# Patient Record
Sex: Female | Born: 1970
Health system: Southern US, Community
[De-identification: ages and names within clinical notes are randomized; demographics above are authoritative.]

## PROBLEM LIST (undated history)

## (undated) DIAGNOSIS — F329 Major depressive disorder, single episode, unspecified: Secondary | ICD-10-CM

## (undated) DIAGNOSIS — N809 Endometriosis, unspecified: Secondary | ICD-10-CM

## (undated) DIAGNOSIS — D219 Benign neoplasm of connective and other soft tissue, unspecified: Secondary | ICD-10-CM

## (undated) DIAGNOSIS — F32A Depression, unspecified: Secondary | ICD-10-CM

## (undated) HISTORY — DX: Benign neoplasm of connective and other soft tissue, unspecified: D21.9

## (undated) HISTORY — DX: Depression, unspecified: F32.A

## (undated) HISTORY — PX: DILATION AND CURETTAGE OF UTERUS: SHX78

## (undated) HISTORY — DX: Major depressive disorder, single episode, unspecified: F32.9

## (undated) HISTORY — PX: LAPAROSCOPY ABDOMEN DIAGNOSTIC: PRO50

## (undated) HISTORY — DX: Endometriosis, unspecified: N80.9

---

## 2007-09-04 LAB — HM COLONOSCOPY

## 2017-07-18 LAB — HM PAP SMEAR: HM PAP: NEGATIVE

## 2017-07-18 LAB — HM MAMMOGRAPHY

## 2018-04-03 ENCOUNTER — Telehealth: Payer: Self-pay | Admitting: Family Medicine

## 2018-04-03 NOTE — Telephone Encounter (Signed)
Received requested records from [facility]. Included are labs, notes, paps and mammograms. Sending back for review.

## 2018-04-04 ENCOUNTER — Encounter: Payer: Self-pay | Admitting: Internal Medicine

## 2018-04-07 ENCOUNTER — Encounter: Payer: Self-pay | Admitting: Family Medicine

## 2018-04-07 ENCOUNTER — Ambulatory Visit: Payer: BLUE CROSS/BLUE SHIELD | Admitting: Family Medicine

## 2018-04-07 VITALS — BP 110/70 | HR 76 | Ht 69.5 in | Wt 173.0 lb

## 2018-04-07 DIAGNOSIS — G8929 Other chronic pain: Secondary | ICD-10-CM | POA: Insufficient documentation

## 2018-04-07 DIAGNOSIS — F3342 Major depressive disorder, recurrent, in full remission: Secondary | ICD-10-CM | POA: Insufficient documentation

## 2018-04-07 DIAGNOSIS — Z7689 Persons encountering health services in other specified circumstances: Secondary | ICD-10-CM

## 2018-04-07 DIAGNOSIS — R5383 Other fatigue: Secondary | ICD-10-CM | POA: Diagnosis not present

## 2018-04-07 DIAGNOSIS — N809 Endometriosis, unspecified: Secondary | ICD-10-CM | POA: Diagnosis not present

## 2018-04-07 DIAGNOSIS — R51 Headache: Secondary | ICD-10-CM

## 2018-04-07 DIAGNOSIS — F32A Depression, unspecified: Secondary | ICD-10-CM

## 2018-04-07 DIAGNOSIS — R519 Headache, unspecified: Secondary | ICD-10-CM | POA: Insufficient documentation

## 2018-04-07 DIAGNOSIS — F329 Major depressive disorder, single episode, unspecified: Secondary | ICD-10-CM

## 2018-04-07 NOTE — Progress Notes (Signed)
Subjective:    Patient ID: Victoria Baxter, female    DOB: 09/03/1971, 47 y.o.   MRN: [id number]  HPI Chief Complaint  Patient presents with  . new pt    new pt get estbalished. depression   She is new to the practice. Here to establish care and for concerns regarding depression and other chronic health conditions. Moved here 2 months ago.  Previous medical care: [place], [place].   States she was healthy prior to 2000. She was then diagnosed with endometriosis and depression.  2008 depression got worse.  She has been on Effexor and Wellbutrin since 2008 and stable however since moving here in July she has been more depressed and even had thoughts of SI. She reports she would never actually hurt herself.   Had a psychiatrist and counselor in [place]. Has not established with anyone here yet.  ?ADHD in the past.  Has been on several psych medications in the past and some have caused her symptoms to worsen.    States she gets "hormonal migraines". Usually excedrin helps.  Triggered by cigarette smoke and other things as well. Thinks certain foods also are triggers.   States she has had allergy testing in the past.  Does wood working   History of fibroids and endometriosis. Questionable abnormal pap smears in the past. HPV status unclear.   Mother diagnosed with uterine cancer.  She was seeing an OB/GYN in [place]. Has not established here yet.  Last Gynecological Exam: 2018. States she gets pap smear annually.  She is on OCPs.   Other providers: Plans to schedule with a psychiatrist and counselor here.  OB/GYN   Social history: Lives alone with her dog and cats, works at [employer] only PT for now.  Has a friend who lives here.  No children.  Is involved in her church.   Denies smoking, drinks alcohol rarely, denies drug use  Colonoscopy 2013  Mammogram 07/18/2017 and normal.   Depression screen PHQ 2/9 04/07/2018  Decreased Interest 2  Down, Depressed, Hopeless 2  PHQ  - 2 Score 4  Altered sleeping 3  Tired, decreased energy 3  Change in appetite 3  Feeling bad or failure about yourself  3  Trouble concentrating 3  Moving slowly or fidgety/restless 3  Suicidal thoughts 3  PHQ-9 Score 25  Difficult doing work/chores Somewhat difficult     Reviewed allergies, medications, past medical, surgical, family, and social history.    Review of Systems Pertinent positives and negatives in the history of present illness.     Objective:   Physical Exam BP 110/70   Pulse 76   Ht 5' 9.5" (1.765 m)   Wt 173 lb (78.5 kg)   BMI 25.18 kg/m   Alert and oriented and in no acute distress. Not otherwise examined.       Assessment & Plan:  Depression, unspecified depression type  Endometriosis  Encounter to establish care  Fatigue, unspecified type  Chronic nonintractable headache, unspecified headache type  She declines labs and plans to return for a fasting CPE and will get labs at that visit.  PHQ-9 is significantly elevated. Discussed this with her.  She does not appear to be in any danger or have a plan to harm herself or others. I strongly advised that if her depression worsens before being able to see a psychiatrist or therapist that she return here and if she has any thoughts of self harm that she call 911 or go to the [facility]  ED. The address was provided to her.  A list of psychiatrists was provided. She will call and schedule an appointment. No medication changes today.  Fatigue is chronic. Discussed multiple etiologies but this is not new and may be linked to depression.  She will call and schedule with an OB/GYN due to history of endometriosis, fibroids, and now mother with uterine cancer. She reports getting annual pap smears. Last one in [place]. No HPV result found in her results. Negative pap smear result in 2018. These will be scanned into her EMR.  Unclear headache etiology. She will continue to monitor triggers.  Follow up for  fasting CPE.

## 2018-04-07 NOTE — Patient Instructions (Addendum)
[facility] emergency department [address] [place], [place] [address]  You can call to schedule your appointment with a psychiatrist and/or counselor. A few offices are listed below for you to call.    [facility]  [place], [place], [place] [address]  Phone: [phone]  [place] [address] [place], [place] [address]  Phone: [phone]    Obgyn Offices:   [place] [address] [place], [place] [phone]  [facility] Address: [address]                   [place], [place] [address] Phone: [phone]  [place] [address] [place], [place] [address] Phone: [phone]  [place],  [address] Phone: [phone]

## 2018-04-08 ENCOUNTER — Encounter: Payer: Self-pay | Admitting: Family Medicine

## 2018-04-08 NOTE — Progress Notes (Signed)
ERROR

## 2018-04-14 ENCOUNTER — Telehealth: Payer: Self-pay | Admitting: Family Medicine

## 2018-04-14 MED ORDER — BUPROPION HCL ER (XL) 150 MG PO TB24: 150.0000 mg | ORAL_TABLET | Freq: Every day | ORAL | 2 refills | Status: DC

## 2018-04-14 MED ORDER — VENLAFAXINE HCL ER 75 MG PO CP24: 75.0000 mg | ORAL_CAPSULE | Freq: Every day | ORAL | 2 refills | Status: DC

## 2018-04-14 NOTE — Telephone Encounter (Signed)
Pt left message that she hasnt gotten her refills for Effexor and Wellbutrin from [facility].  Also she was not sure which [facility] you were sending them to.

## 2018-04-14 NOTE — Telephone Encounter (Signed)
We did not refill this at her time of appt. I have refilled this for 3 months that way she has time to get in with a psych per noted in the office visit notes

## 2018-04-14 NOTE — Telephone Encounter (Signed)
Can you check on this please. Thanks.

## 2018-04-30 ENCOUNTER — Encounter: Payer: BLUE CROSS/BLUE SHIELD | Admitting: Family Medicine

## 2018-05-01 ENCOUNTER — Ambulatory Visit: Payer: BLUE CROSS/BLUE SHIELD | Admitting: Family Medicine

## 2018-05-01 ENCOUNTER — Encounter: Payer: Self-pay | Admitting: Family Medicine

## 2018-05-01 VITALS — BP 120/78 | HR 71 | Ht 70.0 in | Wt 172.0 lb

## 2018-05-01 DIAGNOSIS — F32A Depression, unspecified: Secondary | ICD-10-CM

## 2018-05-01 DIAGNOSIS — Z13220 Encounter for screening for lipoid disorders: Secondary | ICD-10-CM | POA: Diagnosis not present

## 2018-05-01 DIAGNOSIS — Z23 Encounter for immunization: Secondary | ICD-10-CM | POA: Diagnosis not present

## 2018-05-01 DIAGNOSIS — F329 Major depressive disorder, single episode, unspecified: Secondary | ICD-10-CM | POA: Diagnosis not present

## 2018-05-01 DIAGNOSIS — Z0000 Encounter for general adult medical examination without abnormal findings: Secondary | ICD-10-CM

## 2018-05-01 LAB — POCT URINALYSIS DIP (PROADVANTAGE DEVICE)
BILIRUBIN UA: NEGATIVE
GLUCOSE UA: NEGATIVE mg/dL
Ketones, POC UA: NEGATIVE mg/dL
Leukocytes, UA: NEGATIVE
NITRITE UA: NEGATIVE
PH UA: 5.5 (ref 5.0–8.0)
Protein Ur, POC: NEGATIVE mg/dL
SPECIFIC GRAVITY, URINE: 1.03
UUROB: NEGATIVE

## 2018-05-01 NOTE — Progress Notes (Signed)
Subjective:    Patient ID: Victoria Baxter, female    DOB: 08/27/1971, 47 y.o.   MRN: [id number]  HPI Chief Complaint  Patient presents with  . fasting cpe    fasting cpe- no other concerns   She is fairly new to the practice and here for a complete physical exam. No new concerns today.   No longer feeling depressed as she did in early August. Taking medication and doing well.   Other providers: Plans to schedule with a psychiatrist and counselor here.  OB/GYN   Social history: Lives alone with her dog and cats, works at [employer] only PT for now.  Has a friend who lives here.  No children.  Is involved in her church.   Denies smoking, drinking alcohol, drug use  Diet: fairly healthy  Excerise: nothing lately   Immunizations: Tdap unknown.. Declines flu for the year   Health maintenance:  Mammogram: 07/18/2017 and normal  Colonoscopy: 2013 Scheduled with OB/GYN next week  Last Dental Exam: last year  Last Eye Exam: upcoming appointment    Depression screen [facility] 2/9 05/01/2018 04/07/2018  Decreased Interest 1 2  Down, Depressed, Hopeless 0 2  PHQ - 2 Score 1 4  Altered sleeping - 3  Tired, decreased energy - 3  Change in appetite - 3  Feeling bad or failure about yourself  - 3  Trouble concentrating - 3  Moving slowly or fidgety/restless - 3  Suicidal thoughts - 3  PHQ-9 Score - 25  Difficult doing work/chores - Somewhat difficult     Wears seatbelt always, uses sunscreen, smoke detectors in home and functioning, does not text while driving and feels safe in home environment.   Reviewed allergies, medications, past medical, surgical, family, and social history.    Review of Systems Review of Systems Constitutional: -fever, -chills, -sweats, -unexpected weight change,-fatigue ENT: -runny nose, -ear pain, -sore throat Cardiology:  -chest pain, -palpitations, -edema Respiratory: -cough, -shortness of breath, -wheezing Gastroenterology: -abdominal  pain, -nausea, -vomiting, -diarrhea, -constipation  Hematology: -bleeding or bruising problems Musculoskeletal: -arthralgias, -myalgias, -joint swelling, -back pain Ophthalmology: -vision changes Urology: -dysuria, -difficulty urinating, -hematuria, -urinary frequency, -urgency Neurology: -headache, -weakness, -tingling, -numbness       Objective:   Physical Exam BP 120/78   Pulse 71   Ht 5\' 10"  (1.778 m)   Wt 172 lb (78 kg)   LMP 04/24/2018   BMI 24.68 kg/m   General Appearance:    Alert, cooperative, no distress, appears stated age  Head:    Normocephalic, without obvious abnormality, atraumatic  Eyes:    PERRL, conjunctiva/corneas clear, EOM's intact, fundi    benign  Ears:    Normal TM's and external ear canals  Nose:   Nares normal, mucosa normal, no drainage or sinus   tenderness  Throat:   Lips, mucosa, and tongue normal; teeth and gums normal  Neck:   Supple, no lymphadenopathy;  thyroid:  no   enlargement/tenderness/nodules; no carotid   bruit or JVD  Back:    Spine nontender, no curvature, ROM normal, no CVA     tenderness  Lungs:     Clear to auscultation bilaterally without wheezes, rales or     ronchi; respirations unlabored  Chest Wall:    No tenderness or deformity   Heart:    Regular rate and rhythm, S1 and S2 normal, no murmur, rub   or gallop  Breast Exam:    OB/GYN  Abdomen:     Soft, non-tender,  nondistended, normoactive bowel sounds,    no masses, no hepatosplenomegaly  Genitalia:    OB/GYN     Extremities:   No clubbing, cyanosis or edema  Pulses:   2+ and symmetric all extremities  Skin:   Skin color, texture, turgor normal, no rashes or lesions  Lymph nodes:   Cervical, supraclavicular, and axillary nodes normal  Neurologic:   CNII-XII intact, normal strength, sensation and gait; reflexes 2+ and symmetric throughout          Psych:   Normal mood, affect, hygiene and grooming.     Urinalysis dipstick: trace blood, finished cycle yesterday          Assessment & Plan:  Routine general medical examination at a health care facility - Plan: POCT Urinalysis DIP (Proadvantage Device), CBC with Differential/Platelet, Comprehensive metabolic panel, TSH, T4, free, Lipid panel  Depression, unspecified depression type  Need for Tdap vaccination - Plan: Tdap vaccine greater than or equal to 7yo IM  Screening for lipid disorders - Plan: Lipid panel  She is doing much better emotionally than when I saw her earlier this month. Seems to be getting settled in.  In better spirits. PHQ-9 improved significantly.  Discussed safety and health promotions.  She will see her OB/GYN next week.  Due for colonoscopy next year.  Tdap given and all components of vaccine discussed. Declines flu shot.  Follow up pending labs.

## 2018-05-01 NOTE — Patient Instructions (Signed)

## 2018-05-02 ENCOUNTER — Telehealth: Payer: Self-pay | Admitting: Family Medicine

## 2018-05-02 LAB — CBC WITH DIFFERENTIAL/PLATELET
BASOS: 1 %
Basophils Absolute: 0.1 10*3/uL (ref 0.0–0.2)
EOS (ABSOLUTE): 0.1 10*3/uL (ref 0.0–0.4)
EOS: 1 %
HEMATOCRIT: 46.5 % (ref 34.0–46.6)
Hemoglobin: 16.2 g/dL — ABNORMAL HIGH (ref 11.1–15.9)
Immature Grans (Abs): 0 10*3/uL (ref 0.0–0.1)
Immature Granulocytes: 0 %
LYMPHS ABS: 2.8 10*3/uL (ref 0.7–3.1)
Lymphs: 43 %
MCH: 31.3 pg (ref 26.6–33.0)
MCHC: 34.8 g/dL (ref 31.5–35.7)
MCV: 90 fL (ref 79–97)
MONOS ABS: 0.3 10*3/uL (ref 0.1–0.9)
Monocytes: 4 %
Neutrophils Absolute: 3.4 10*3/uL (ref 1.4–7.0)
Neutrophils: 51 %
PLATELETS: 329 10*3/uL (ref 150–450)
RBC: 5.17 x10E6/uL (ref 3.77–5.28)
RDW: 12.4 % (ref 12.3–15.4)
WBC: 6.7 10*3/uL (ref 3.4–10.8)

## 2018-05-02 LAB — LIPID PANEL
CHOL/HDL RATIO: 3.7 ratio (ref 0.0–4.4)
Cholesterol, Total: 231 mg/dL — ABNORMAL HIGH (ref 100–199)
HDL: 63 mg/dL (ref >39)
LDL CALC: 141 mg/dL — AB (ref 0–99)
TRIGLYCERIDES: 135 mg/dL (ref 0–149)
VLDL Cholesterol Cal: 27 mg/dL (ref 5–40)

## 2018-05-02 LAB — COMPREHENSIVE METABOLIC PANEL
ALK PHOS: 95 IU/L (ref 39–117)
ALT: 32 IU/L (ref 0–32)
AST: 22 IU/L (ref 0–40)
Albumin/Globulin Ratio: 1.8 (ref 1.2–2.2)
Albumin: 4.6 g/dL (ref 3.5–5.5)
BILIRUBIN TOTAL: 0.5 mg/dL (ref 0.0–1.2)
BUN / CREAT RATIO: 24 — AB (ref 9–23)
BUN: 19 mg/dL (ref 6–24)
CO2: 24 mmol/L (ref 20–29)
CREATININE: 0.8 mg/dL (ref 0.57–1.00)
Calcium: 9.8 mg/dL (ref 8.7–10.2)
Chloride: 104 mmol/L (ref 96–106)
GFR, EST AFRICAN AMERICAN: 100 mL/min/{1.73_m2} (ref >59)
GFR, EST NON AFRICAN AMERICAN: 87 mL/min/{1.73_m2} (ref >59)
GLOBULIN, TOTAL: 2.5 g/dL (ref 1.5–4.5)
Glucose: 85 mg/dL (ref 65–99)
Potassium: 5.3 mmol/L — ABNORMAL HIGH (ref 3.5–5.2)
Sodium: 142 mmol/L (ref 134–144)
Total Protein: 7.1 g/dL (ref 6.0–8.5)

## 2018-05-02 LAB — T4, FREE: Free T4: 0.91 ng/dL (ref 0.82–1.77)

## 2018-05-02 LAB — TSH: TSH: 1.35 u[IU]/mL (ref 0.450–4.500)

## 2018-05-02 NOTE — Telephone Encounter (Signed)
Received requested records from [facility]. Sending back for review.

## 2018-05-07 ENCOUNTER — Encounter: Payer: Self-pay | Admitting: Obstetrics and Gynecology

## 2018-05-07 ENCOUNTER — Other Ambulatory Visit: Payer: Self-pay

## 2018-05-07 ENCOUNTER — Ambulatory Visit: Payer: BLUE CROSS/BLUE SHIELD | Admitting: Obstetrics and Gynecology

## 2018-05-07 VITALS — BP 118/78 | HR 76 | Resp 14 | Ht 70.5 in | Wt 171.2 lb

## 2018-05-07 DIAGNOSIS — Z01411 Encounter for gynecological examination (general) (routine) with abnormal findings: Secondary | ICD-10-CM | POA: Diagnosis not present

## 2018-05-07 DIAGNOSIS — Z86018 Personal history of other benign neoplasm: Secondary | ICD-10-CM

## 2018-05-07 DIAGNOSIS — Z8742 Personal history of other diseases of the female genital tract: Secondary | ICD-10-CM | POA: Diagnosis not present

## 2018-05-07 DIAGNOSIS — Z7689 Persons encountering health services in other specified circumstances: Secondary | ICD-10-CM | POA: Diagnosis not present

## 2018-05-07 MED ORDER — NORETHIN ACE-ETH ESTRAD-FE 1.5-30 MG-MCG PO TABS: 1.0000 | ORAL_TABLET | Freq: Every day | ORAL | 0 refills | Status: DC

## 2018-05-07 NOTE — Progress Notes (Signed)
47 y.o. G0P0000 SingleCaucasianF here for annual exam.  She is on OCP's, cycling every 2 months. Was spotting on 3 month cycle. She has a h/o endometriosis, diagnosed on laparoscopy. She was told it "looked like a bomb went off in her pelvis". Treated as much of the endometriosis as possible, then treated with lupron for 6 months. She is on OCP's for suppression, which is helping.  She has migraines without aura, worse the week prior to and off her cycle.  She has had worsening migraines in the last month, thinks it is environmental. Food can trigger her headaches.  She has had bad depression in the past, worse on OCP's, better with the OCP with iron. She is on medication for her depression, which helps. Last month was a bad month, currently better. Just moved here in June, 2019. She also has a h/o fibroid uterus, h/o irregular bleeding. She has never had very bad cramps.  Not sexually active.   Period Cycle (Days): 60 Period Duration (Days): 7 Period Pattern: (!) Irregular Menstrual Flow: Light Menstrual Control: Panty liner Dysmenorrhea: (!) Mild Dysmenorrhea Symptoms: Other (Comment)(lower back pain )  Patient's last menstrual period was 04/24/2018.          Sexually active: No.  The current method of family planning is OCP (estrogen/progesterone).    Exercising: No.  The patient does not participate in regular exercise at present. Smoker:  no  Health Maintenance: Pap:  07-18-17 negative  History of abnormal Pap:  no MMG:  07-18-17 BIRADS 1 negative   Colonoscopy: ?2013 BMD:   n/a TDaP:  05-01-18  Gardasil: no    reports that she has never smoked. She has never used smokeless tobacco. She reports that she drinks alcohol. She reports that she does not use drugs. She has a 24 year old dog and cats. 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 Moved her for a change. Likes it here. Has a really good friend who lives here.   Past Medical History:  Diagnosis Date  .  Depression   . Endometriosis   . Fibroid     Past Surgical History:  Procedure Laterality Date  . DILATION AND CURETTAGE OF UTERUS    . LAPAROSCOPY ABDOMEN DIAGNOSTIC      Current Outpatient Medications  Medication Sig Dispense Refill  . ASHWAGANDHA PO Take by mouth.    Marland Kitchen buPROPion (WELLBUTRIN XL) 150 MG 24 hr tablet Take 1 tablet (150 mg total) by mouth daily. 30 tablet 2  . Cholecalciferol (VITAMIN D PO) Take by mouth daily.     . NON FORMULARY 700 mg. Guduchi     . norethindrone-ethinyl estradiol-iron (BLISOVI FE 1.5/30) 1.5-30 MG-MCG tablet Take 1 tablet by mouth daily.    Marland Kitchen venlafaxine XR (EFFEXOR-XR) 75 MG 24 hr capsule Take 1 capsule (75 mg total) by mouth daily with breakfast. 30 capsule 2   No current facility-administered medications for this visit.     Family History  Problem Relation Age of Onset  . Uterine cancer Mother 53  . Other Mother        brain tumor 2001  . Basal cell carcinoma Father   . Prostate cancer Father   . Other Father        sarcomatoid carcinoma  . Breast cancer Maternal Aunt   . Cancer Paternal Aunt   . Diabetes Paternal Grandmother     Review of Systems  Constitutional: Negative.   HENT: Negative.   Eyes: Negative.  Respiratory: Negative.   Cardiovascular: Negative.   Gastrointestinal: Negative.   Endocrine: Negative.   Genitourinary: Negative.   Musculoskeletal: Negative.   Skin: Negative.   Allergic/Immunologic: Negative.   Neurological: Negative.   Hematological: Negative.   Psychiatric/Behavioral: Negative.     Exam:   BP 118/78 (BP Location: Right Arm, Patient Position: Sitting, Cuff Size: Normal)   Pulse 76   Resp 14   Ht 5' 10.5" (1.791 m)   Wt 171 lb 4 oz (77.7 kg)   LMP 04/24/2018   BMI 24.22 kg/m   Weight change: @WEIGHTCHANGE @ Height:   Height: 5' 10.5" (179.1 cm)  Ht Readings from Last 3 Encounters:  05/07/18 5' 10.5" (1.791 m)  05/01/18 5\' 10"  (1.778 m)  04/07/18 5' 9.5" (1.765 m)    General  appearance: alert, cooperative and appears stated age  A:  Establish care  H/O endometriosis, on OCP's for suppression  H/O fibroid uterus  H/O Depression, on medication  H/O migraine headaches without aura, recently worse, likely environmental  P:   Patient will return in 11/19 for an annual exam  Mammogram due in 11/19, # given  Refill for OCP's sent   If her migraines get worse again she will f/u with her primary   Records reviewed: -U/S 4/12: 5 fibroids, 4 pedunculated, one posterior myometrium (1.4 cm , 3 cm, 7.9 cm, 3 cm, 4.2 cm). Overal uterus not measured  -On review of records mention of history of galactorrhea -After her endometriosis surgery she was treated with danzol for 6 months -Hysteroscopy, D&C 03/24/07, no intracavitary defects -03/19/11, Laparoscopy with rupture of left ovarian cyst and multiple biopsies, mild diffuse pelvic endometriosis was seen and fibroids were noted. It was a "chocholate" cyst (not removed, just aspirated). Several brown lesions were noted on the parietal peritoneum lateral to the lever, suspicious for endometriosis.   Over 20 minutes was spent face to face with the patient, over 50% in counseling

## 2018-07-08 ENCOUNTER — Other Ambulatory Visit: Payer: Self-pay | Admitting: Family Medicine

## 2018-07-08 NOTE — Telephone Encounter (Signed)
Is this okay to refill? 

## 2018-07-09 ENCOUNTER — Encounter: Payer: Self-pay | Admitting: Family Medicine

## 2018-07-09 NOTE — Telephone Encounter (Signed)
Ok to give her 90 days and 1 refill. I reviewed her records from previous PCP

## 2018-07-23 NOTE — Progress Notes (Signed)
47 y.o. G0P0000 Single White or Caucasian Not Hispanic or Latino female here for annual exam.  On OCP's to suppress endometriosis.  Period Cycle (Days): 56 Period Duration (Days): 7 days  Period Pattern: Regular(due to OCP) Menstrual Flow: Light Menstrual Control: Thin pad Menstrual Control Change Freq (Hours): changes pad once a day Dysmenorrhea: None  Not sexually active.  Discussed h/o galactorrhea, patient states she hasn't had this for years.   After her visit to establish care her records were received and reviewed: Records reviewed: -U/S 4/12: 5 fibroids, 4 pedunculated, one posterior myometrium (1.4 cm , 3 cm, 7.9 cm, 3 cm, 4.2 cm). Overal uterus not measured  -On review of records mention of history of galactorrhea -After her endometriosis surgery she was treated with danzol for 6 months -Hysteroscopy, D&C 03/24/07, no intracavitary defects -03/19/11, Laparoscopy with rupture of left ovarian cyst and multiple biopsies, mild diffuse pelvic endometriosis was seen and fibroids were noted. It was a "chocholate" cyst (not removed, just aspirated). Several brown lesions were noted on the parietal peritoneum lateral to the lever, suspicious for endometriosis.  Patient's last menstrual period was 06/04/2018 (exact date).          Sexually active: No.  The current method of family planning is OCP (estrogen/progesterone).    Exercising: No.  The patient does not participate in regular exercise at present. Smoker:  no  Health Maintenance: Pap:  07-18-17 negative  History of abnormal Pap:  no MMG:  07-18-17 BIRADS 1 negative   Colonoscopy: 09/04/2007 BMD:   n/a TDaP:  05-01-18  Gardasil: no    reports that she has never smoked. She has never used smokeless tobacco. She reports that she drinks alcohol. She reports that she does not use drugs. Rare ETOH. She has a 96 year old dog (Production assistant, radio dog/Boxer mix) and cats. 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 Family is in  [place].   Past Medical History:  Diagnosis Date  . Depression   . Depression   . Endometriosis   . Fibroid     Past Surgical History:  Procedure Laterality Date  . DILATION AND CURETTAGE OF UTERUS    . LAPAROSCOPY ABDOMEN DIAGNOSTIC      Current Outpatient Medications  Medication Sig Dispense Refill  . ASHWAGANDHA PO Take by mouth.    Marland Kitchen buPROPion (WELLBUTRIN XL) 150 MG 24 hr tablet TAKE 1 TABLET(150 MG) BY MOUTH DAILY 30 tablet 5  . Cholecalciferol (VITAMIN D PO) Take by mouth daily.     . NON FORMULARY 700 mg. Guduchi     . norethindrone-ethinyl estradiol-iron (BLISOVI FE 1.5/30) 1.5-30 MG-MCG tablet Take 1 tablet by mouth daily. Take continuously for 2-3 months in a row. 4 Package 0  . venlafaxine XR (EFFEXOR-XR) 75 MG 24 hr capsule TAKE 1 CAPSULE(75 MG) BY MOUTH DAILY WITH BREAKFAST 30 capsule 5   No current facility-administered medications for this visit.     Family History  Problem Relation Age of Onset  . Uterine cancer Mother 3  . Other Mother        brain tumor 2001  . Basal cell carcinoma Father   . Prostate cancer Father   . Other Father        sarcomatoid carcinoma  . Breast cancer Maternal Aunt   . Cancer Paternal Aunt   . Diabetes Paternal Grandmother     Review of Systems  Constitutional: Negative.   HENT: Negative.   Eyes: Negative.   Respiratory:  Negative.   Cardiovascular: Negative.   Gastrointestinal: Negative.   Endocrine: Negative.   Genitourinary: Negative.   Musculoskeletal: Negative.   Skin: Negative.   Allergic/Immunologic: Negative.   Neurological: Negative.   Hematological: Negative.   Psychiatric/Behavioral: Negative.     Exam:   BP 122/78 (BP Location: Right Arm, Patient Position: Sitting, Cuff Size: Normal)   Pulse 68   Ht 5\' 10"  (1.778 m)   Wt 173 lb 3.2 oz (78.6 kg)   LMP 06/04/2018 (Exact Date) Comment: on OCP  BMI 24.85 kg/m   Weight change: @WEIGHTCHANGE @ Height:   Height: 5\' 10"  (177.8 cm)  Ht Readings from Last 3  Encounters:  07/24/18 5\' 10"  (1.778 m)  05/07/18 5' 10.5" (1.791 m)  05/01/18 5\' 10"  (1.778 m)    General appearance: alert, cooperative and appears stated age Head: Normocephalic, without obvious abnormality, atraumatic Neck: no adenopathy, supple, symmetrical, trachea midline and thyroid normal to inspection and palpation Lungs: clear to auscultation bilaterally Cardiovascular: regular rate and rhythm Breasts: normal appearance, no masses or tenderness Abdomen: soft, non-tender; non distended,  no masses,  no organomegaly Extremities: extremities normal, atraumatic, no cyanosis or edema Skin: Skin color, texture, turgor normal. No rashes or lesions Lymph nodes: Cervical, supraclavicular, and axillary nodes normal. No abnormal inguinal nodes palpated Neurologic: Grossly normal   Pelvic: External genitalia:  no lesions              Urethra:  normal appearing urethra with no masses, tenderness or lesions              Bartholins and Skenes: normal                 Vagina: normal appearing vagina with normal color and discharge, no lesions              Cervix: no lesions               Bimanual Exam:  Uterus:  uterus retroverted, ~10 week sized, non tender, mobile.               Adnexa: no mass, fullness, tenderness               Rectovaginal: Confirms               Anus:  normal sphincter tone, no lesions  Chaperone was present for exam.  A:  Well Woman with normal exam  H/O endometriosis, on OCP's for suspression  Fibroid uterus  P:   Pap with hpv  Discussed breast self exam  Discussed calcium and vit D intake  Labs with primary  IFOB this year, colonoscopy next year  Mammogram due  Continue OCP's

## 2018-07-24 ENCOUNTER — Other Ambulatory Visit: Payer: Self-pay

## 2018-07-24 ENCOUNTER — Other Ambulatory Visit (HOSPITAL_COMMUNITY)
Admission: RE | Admit: 2018-07-24 | Discharge: 2018-07-24 | Disposition: A | Discharge status: Home / Self Care | Payer: BLUE CROSS/BLUE SHIELD | Source: Ambulatory Visit | Attending: Obstetrics and Gynecology | Admitting: Obstetrics and Gynecology

## 2018-07-24 ENCOUNTER — Ambulatory Visit (INDEPENDENT_AMBULATORY_CARE_PROVIDER_SITE_OTHER): Payer: BLUE CROSS/BLUE SHIELD | Admitting: Obstetrics and Gynecology

## 2018-07-24 ENCOUNTER — Encounter: Payer: Self-pay | Admitting: Obstetrics and Gynecology

## 2018-07-24 VITALS — BP 122/78 | HR 68 | Ht 70.0 in | Wt 173.2 lb

## 2018-07-24 DIAGNOSIS — Z124 Encounter for screening for malignant neoplasm of cervix: Secondary | ICD-10-CM | POA: Insufficient documentation

## 2018-07-24 DIAGNOSIS — Z3041 Encounter for surveillance of contraceptive pills: Secondary | ICD-10-CM

## 2018-07-24 DIAGNOSIS — Z01419 Encounter for gynecological examination (general) (routine) without abnormal findings: Secondary | ICD-10-CM | POA: Diagnosis not present

## 2018-07-24 DIAGNOSIS — Z1211 Encounter for screening for malignant neoplasm of colon: Secondary | ICD-10-CM

## 2018-07-24 DIAGNOSIS — D259 Leiomyoma of uterus, unspecified: Secondary | ICD-10-CM

## 2018-07-24 DIAGNOSIS — Z8742 Personal history of other diseases of the female genital tract: Secondary | ICD-10-CM

## 2018-07-24 MED ORDER — NORETHIN ACE-ETH ESTRAD-FE 1.5-30 MG-MCG PO TABS: 1.0000 | ORAL_TABLET | Freq: Every day | ORAL | 3 refills | Status: DC

## 2018-07-24 NOTE — Patient Instructions (Signed)
EXERCISE AND DIET:  We recommended that you start or continue a regular exercise program for good health. Regular exercise means any activity that makes your heart beat faster and makes you sweat.  We recommend exercising at least 30 minutes per day at least 3 days a week, preferably 4 or 5.  We also recommend a diet low in fat and sugar.  Inactivity, poor dietary choices and obesity can cause diabetes, heart attack, stroke, and kidney damage, among others.    ALCOHOL AND SMOKING:  Women should limit their alcohol intake to no more than 7 drinks/beers/glasses of wine (combined, not each!) per week. Moderation of alcohol intake to this level decreases your risk of breast cancer and liver damage. And of course, no recreational drugs are part of a healthy lifestyle.  And absolutely no smoking or even second hand smoke. Most people know smoking can cause heart and lung diseases, but did you know it also contributes to weakening of your bones? Aging of your skin?  Yellowing of your teeth and nails?  CALCIUM AND VITAMIN D:  Adequate intake of calcium and Vitamin D are recommended.  The recommendations for exact amounts of these supplements seem to change often, but generally speaking 1,000 mg of calcium (between diet and supplement) and 800 units of Vitamin D per day seems prudent. Certain women may benefit from higher intake of Vitamin D.  If you are among these women, your doctor will have told you during your visit.    PAP SMEARS:  Pap smears, to check for cervical cancer or precancers,  have traditionally been done yearly, although recent scientific advances have shown that most women can have pap smears less often.  However, every woman still should have a physical exam from her gynecologist every year. It will include a breast check, inspection of the vulva and vagina to check for abnormal growths or skin changes, a visual exam of the cervix, and then an exam to evaluate the size and shape of the uterus and  ovaries.  And after 49 years of age, a rectal exam is indicated to check for rectal cancers. We will also provide age appropriate advice regarding health maintenance, like when you should have certain vaccines, screening for sexually transmitted diseases, bone density testing, colonoscopy, mammograms, etc.   MAMMOGRAMS:  All women over 10 years old should have a yearly mammogram. Many facilities now offer a "3D" mammogram, which may cost around $50 extra out of pocket. If possible,  we recommend you accept the option to have the 3D mammogram performed.  It both reduces the number of women who will be called back for extra views which then turn out to be normal, and it is better than the routine mammogram at detecting truly abnormal areas.    COLONOSCOPY:  Colonoscopy to screen for colon cancer is recommended for all women at age 49.  We know, you hate the idea of the prep.  We agree, BUT, having colon cancer and not knowing it is worse!!  Colon cancer so often starts as a polyp that can be seen and removed at colonscopy, which can quite literally save your life!  And if your first colonoscopy is normal and you have no family history of colon cancer, most women don't have to have it again for 10 years.  Once every ten years, you can do something that may end up saving your life, right?  We will be happy to help you get it scheduled when you are ready.  Be sure to check your insurance coverage so you understand how much it will cost.  It may be covered as a preventative service at no cost, but you should check your particular policy.      Breast Self-Awareness Breast self-awareness means being familiar with how your breasts look and feel. It involves checking your breasts regularly and reporting any changes to your health care provider. Practicing breast self-awareness is important. A change in your breasts can be a sign of a serious medical problem. Being familiar with how your breasts look and feel allows  you to find any problems early, when treatment is more likely to be successful. All women should practice breast self-awareness, including women who have had breast implants. How to do a breast self-exam One way to learn what is normal for your breasts and whether your breasts are changing is to do a breast self-exam. To do a breast self-exam: Look for Changes  1. Remove all the clothing above your waist. 2. Stand in front of a mirror in a room with good lighting. 3. Put your hands on your hips. 4. Push your hands firmly downward. 5. Compare your breasts in the mirror. Look for differences between them (asymmetry), such as: ? Differences in shape. ? Differences in size. ? Puckers, dips, and bumps in one breast and not the other. 6. Look at each breast for changes in your skin, such as: ? Redness. ? Scaly areas. 7. Look for changes in your nipples, such as: ? Discharge. ? Bleeding. ? Dimpling. ? Redness. ? A change in position. Feel for Changes  Carefully feel your breasts for lumps and changes. It is best to do this while lying on your back on the floor and again while sitting or standing in the shower or tub with soapy water on your skin. Feel each breast in the following way:  Place the arm on the side of the breast you are examining above your head.  Feel your breast with the other hand.  Start in the nipple area and make  inch (2 cm) overlapping circles to feel your breast. Use the pads of your three middle fingers to do this. Apply light pressure, then medium pressure, then firm pressure. The light pressure will allow you to feel the tissue closest to the skin. The medium pressure will allow you to feel the tissue that is a little deeper. The firm pressure will allow you to feel the tissue close to the ribs.  Continue the overlapping circles, moving downward over the breast until you feel your ribs below your breast.  Move one finger-width toward the center of the body.  Continue to use the  inch (2 cm) overlapping circles to feel your breast as you move slowly up toward your collarbone.  Continue the up and down exam using all three pressures until you reach your armpit.  Write Down What You Find  Write down what is normal for each breast and any changes that you find. Keep a written record with breast changes or normal findings for each breast. By writing this information down, you do not need to depend only on memory for size, tenderness, or location. Write down where you are in your menstrual cycle, if you are still menstruating. If you are having trouble noticing differences in your breasts, do not get discouraged. With time you will become more familiar with the variations in your breasts and more comfortable with the exam. How often should I examine my breasts? Examine   your breasts every month. If you are breastfeeding, the best time to examine your breasts is after a feeding or after using a breast pump. If you menstruate, the best time to examine your breasts is 5-7 days after your period is over. During your period, your breasts are lumpier, and it may be more difficult to notice changes. When should I see my health care provider? See your health care provider if you notice:  A change in shape or size of your breasts or nipples.  A change in the skin of your breast or nipples, such as a reddened or scaly area.  Unusual discharge from your nipples.  A lump or thick area that was not there before.  Pain in your breasts.  Anything that concerns you.  This information is not intended to replace advice given to you by your health care provider. Make sure you discuss any questions you have with your health care provider. Document Released: 08/20/2005 Document Revised: 01/26/2016 Document Reviewed: 07/10/2015 Elsevier Interactive Patient Education  [employer].

## 2018-07-29 LAB — CYTOLOGY - PAP
Diagnosis: NEGATIVE
HPV (WINDOPATH): NOT DETECTED

## 2018-07-30 ENCOUNTER — Telehealth: Payer: Self-pay

## 2018-07-30 NOTE — Telephone Encounter (Signed)
Left message to call Kaitlyn at [phone]. 

## 2018-07-30 NOTE — Telephone Encounter (Signed)
-----   Message from Victoria Dom, MD sent at 07/30/2018  4:00 PM EST ----- Please inform the patient that her pap returned with yeast, if symptomatic treat with diflucan 150 mg x 1, may repeat in 72 hours if still symptomatic. #2, no refills 02 recall

## 2018-08-04 NOTE — Telephone Encounter (Signed)
Left message to call Zema Lizardo at [phone]. 

## 2018-08-06 NOTE — Telephone Encounter (Signed)
Spoke with patient. Advised of message as seen below from Carter. Patient verbalizes understanding. Patient is not symptomatic and is aware no treatment is needed at this time. Will call if she develops any symptoms. 02 recall entered. Encounter closed.

## 2018-09-21 ENCOUNTER — Encounter: Payer: Self-pay | Admitting: Family Medicine

## 2018-11-27 LAB — FECAL OCCULT BLOOD, IMMUNOCHEMICAL: Fecal Occult Bld: NEGATIVE

## 2018-12-02 ENCOUNTER — Other Ambulatory Visit: Payer: Self-pay

## 2018-12-02 ENCOUNTER — Other Ambulatory Visit: Payer: Self-pay | Admitting: *Deleted

## 2018-12-02 NOTE — Telephone Encounter (Signed)
Call to patient. Per ROI, can leave message on voice mail (has name confirmation) . Left message to call back regarding refill.  [facility].  Previously sent to local pharmacy. Need to confirm pharmacy of choice.

## 2018-12-02 NOTE — Telephone Encounter (Signed)
lmom asking patient to call back and schedule appt,

## 2018-12-02 NOTE — Telephone Encounter (Signed)
Needs virtual visit for medication management. Thanks.

## 2018-12-03 ENCOUNTER — Other Ambulatory Visit: Payer: Self-pay

## 2018-12-03 ENCOUNTER — Ambulatory Visit: Payer: 59 | Admitting: Family Medicine

## 2018-12-03 ENCOUNTER — Encounter: Payer: 59 | Admitting: Family Medicine

## 2018-12-03 ENCOUNTER — Encounter: Payer: Self-pay | Admitting: Family Medicine

## 2018-12-03 VITALS — Wt 165.0 lb

## 2018-12-03 DIAGNOSIS — F329 Major depressive disorder, single episode, unspecified: Secondary | ICD-10-CM | POA: Diagnosis not present

## 2018-12-03 DIAGNOSIS — Z79899 Other long term (current) drug therapy: Secondary | ICD-10-CM

## 2018-12-03 DIAGNOSIS — F32A Depression, unspecified: Secondary | ICD-10-CM

## 2018-12-03 MED ORDER — VENLAFAXINE HCL ER 75 MG PO CP24: ORAL_CAPSULE | ORAL | 1 refills | Status: DC

## 2018-12-03 MED ORDER — BUPROPION HCL ER (XL) 150 MG PO TB24: ORAL_TABLET | ORAL | 1 refills | Status: DC

## 2018-12-03 NOTE — Telephone Encounter (Signed)
Pt had an appt today

## 2018-12-03 NOTE — Progress Notes (Signed)
Documentation for Telephone encounter:  This telephone service is not related to other E/M service within previous 7 days.  Patient consented to the consult.  This telephone consult involved patient and myself, Harland Dingwall, NP-C   Subjective: Chief Complaint  Patient presents with  . medcheck    med check    She is requesting refills on medications for depression. States she is doing great. Work is going well, recent promotion at [employer] No new concerns today.  Taking Wellbutrin 150 mg and Effexor XR 75 mg daily. Would like to continue. This is working well.  Has been on these medications since 2008.   She is not seeing a Social worker.   Denies fever, chills, dizziness, chest pain, palpitations, shortness of breath, abdominal pain, N/V/D.   Depression screen [facility] 2/9 12/03/2018 05/01/2018 04/07/2018  Decreased Interest 0 1 2  Down, Depressed, Hopeless 0 0 2  PHQ - 2 Score 0 1 4  Altered sleeping - - 3  Tired, decreased energy - - 3  Change in appetite - - 3  Feeling bad or failure about yourself  - - 3  Trouble concentrating - - 3  Moving slowly or fidgety/restless - - 3  Suicidal thoughts - - 3  PHQ-9 Score - - 25  Difficult doing work/chores - - Somewhat difficult    On OCPs and doing fine.  Sees OB/GYN Dr. Talbert Nan for well women visits.    Objective: Wt 165 lb (74.8 kg)   BMI 23.68 kg/m  Alert and oriented and in no distress.    Assessment: Medication management  Depression, unspecified depression type     Plan: Depression- well managed with medication. Enjoying life and work.  Refill medication and follow up as needed or in 6 month for depression and fasting CPE if needed.    Time involving medical discussion was 5 minutes.  99441 (5-25min) 99442 (11-32min) 99443 (21-103min)

## 2018-12-03 NOTE — Telephone Encounter (Signed)
Medication refill request: OCP  Last AEX:  07/24/18 JJ Next AEX: 08/05/19 JJ Last MMG (if hormonal medication request): 2018 Refill authorized:  07/24/18 #4packs/3R to [facility].  [facility].

## 2018-12-10 NOTE — Telephone Encounter (Signed)
Need to confirm pharmacy. Patient did not call back Encounter closed.

## 2018-12-24 ENCOUNTER — Telehealth: Payer: Self-pay | Admitting: Obstetrics and Gynecology

## 2018-12-24 NOTE — Telephone Encounter (Signed)
[facility] made as primary pharmacy. Encounter closed.

## 2018-12-24 NOTE — Telephone Encounter (Signed)
Patient returned a call from 12/10/18 regarding her pharmacy. She said [facility] is the correct pharmacy. She said nor need to return call unless you have questions.

## 2018-12-26 ENCOUNTER — Other Ambulatory Visit: Payer: Self-pay

## 2018-12-26 NOTE — Telephone Encounter (Signed)
Medication refill request: blisovi 1.5 30 Last AEX:  07-24-2018 Next AEX: 08-05-2019 Last MMG (if hormonal medication request): 07-18-17 birads 1:neg [facility] requesting 90 day supply. Please approve if appropriate.

## 2018-12-27 MED ORDER — NORETHIN ACE-ETH ESTRAD-FE 1.5-30 MG-MCG PO TABS: 1.0000 | ORAL_TABLET | Freq: Every day | ORAL | 0 refills | Status: DC

## 2019-02-16 ENCOUNTER — Other Ambulatory Visit: Payer: Self-pay | Admitting: Obstetrics and Gynecology

## 2019-02-16 NOTE — Telephone Encounter (Signed)
She was prescribed enough OCP's for a year in 11/19. Refills sent again for another 6 months.

## 2019-02-16 NOTE — Telephone Encounter (Signed)
Medication refill request: Junel  Last AEX:  07-24-18 JJ Next AEX: 08-05-2019 Last MMG (if hormonal medication request): n/a Refill authorized: 12-27-2018, 4packs, 0RF. Today, please advise.    Medication pended for #84, 0RF. Please refill if appropriate.

## 2019-05-24 ENCOUNTER — Other Ambulatory Visit: Payer: Self-pay | Admitting: Family Medicine

## 2019-05-25 NOTE — Telephone Encounter (Signed)
Ok to refill 3 months. Lets schedule a visit at some point in the next 3 months, she is overdue for CPE.

## 2019-05-25 NOTE — Telephone Encounter (Signed)
Is this okay to refill? 

## 2019-05-25 NOTE — Telephone Encounter (Signed)
Pt is scheduled on 10/7

## 2019-06-10 ENCOUNTER — Encounter: Payer: 59 | Admitting: Family Medicine

## 2019-08-05 ENCOUNTER — Ambulatory Visit: Payer: BLUE CROSS/BLUE SHIELD | Admitting: Obstetrics and Gynecology

## 2019-08-23 ENCOUNTER — Other Ambulatory Visit: Payer: Self-pay | Admitting: Obstetrics and Gynecology

## 2019-08-31 NOTE — Progress Notes (Signed)
48 y.o. G0P0000 Single White or Caucasian Not Hispanic or Latino female here for annual exam.  She is on OCP's to suppress endometriosis. Patient states that she has not had any symptoms of menopause but would like to talk about what to look for as far as symptoms.  She gives herself a cycle every other month on the pill.  Period Cycle (Days): 28 Period Duration (Days): 1 Menstrual Flow: Light Menstrual Control: Panty liner Menstrual Control Change Freq (Hours): twice a day Dysmenorrhea: None   Past history significant for: -U/S 4/12: 5 fibroids, 4 pedunculated, one posterior myometrium (1.4 cm , 3 cm, 7.9 cm, 3 cm, 4.2 cm). Overal uterus not measured  -On review of records mention of history of galactorrhea (10-15 years ago, work up negative, ended) -After her endometriosis surgery she was treated with danzol for 6 months -Hysteroscopy, D&C 03/24/07, no intracavitary defects -03/19/11, Laparoscopy with rupture of left ovarian cyst and multiple biopsies, mild diffuse pelvic endometriosis was seen and fibroids were noted. It was a "chocholate" cyst (not removed, just aspirated). Several brown lesions were noted on the parietal peritoneum lateral to the lever, suspicious for endometriosis. Patient's last menstrual period was 08/09/2019.          Sexually active: No.  The current method of family planning is OCP (estrogen/progesterone).    Exercising: Yes.    The patient does not participate in regular exercise at present. Smoker:  no  Health Maintenance: Pap:07/24/2018 WNL NEG HPV, 07-18-17 negative History of abnormal Pap:no MMG:07-18-17 BIRADS 1 negative Colonoscopy:09/04/2007, 3/20 IFOB negative.  BMD:n/a TDaP:05-01-18 Gardasil:no   reports that she has never smoked. She has never used smokeless tobacco. She reports current alcohol use. She reports that she does not use drugs. 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 Family is in [place].   Past  Medical History:  Diagnosis Date  . Depression   . Depression   . Endometriosis   . Fibroid     Past Surgical History:  Procedure Laterality Date  . DILATION AND CURETTAGE OF UTERUS    . LAPAROSCOPY ABDOMEN DIAGNOSTIC      Current Outpatient Medications  Medication Sig Dispense Refill  . buPROPion (WELLBUTRIN XL) 150 MG 24 hr tablet TAKE 1 TABLET DAILY 90 tablet 1  . Cholecalciferol (VITAMIN D PO) Take by mouth daily.     . JUNEL FE 1.5/30 1.5-30 MG-MCG tablet TAKE 1 TABLET DAILY TAKE   CONTINUOUSLY FOR 2-3 MONTHSIN A ROW. 4 Package 1  . NON FORMULARY 700 mg. Guduchi     . venlafaxine XR (EFFEXOR-XR) 75 MG 24 hr capsule TAKE 1 CAPSULE DAILY WITH  BREAKFAST 90 capsule 1   No current facility-administered medications for this visit.    Family History  Problem Relation Age of Onset  . Uterine cancer Mother 75  . Other Mother        brain tumor 2001  . Basal cell carcinoma Father   . Prostate cancer Father   . Other Father        sarcomatoid carcinoma  . Breast cancer Maternal Aunt   . Cancer Paternal Aunt   . Diabetes Paternal Grandmother     Review of Systems  Constitutional: Negative.   HENT: Negative.   Eyes: Negative.   Respiratory: Negative.   Cardiovascular: Negative.   Gastrointestinal: Negative.   Endocrine: Negative.   Genitourinary: Negative.   Musculoskeletal: Negative.   Skin: Negative.   Allergic/Immunologic: Negative.   Neurological: Negative.  Hematological: Negative.   Psychiatric/Behavioral:       Depression    Exam:   BP 124/70   Pulse 67   Temp 98.4 F (36.9 C)   Ht 5' 10.25" (1.784 m)   Wt 165 lb 6.4 oz (75 kg)   LMP 08/09/2019   SpO2 97%   BMI 23.56 kg/m   Weight change: @WEIGHTCHANGE @ Height:   Height: 5' 10.25" (178.4 cm)  Ht Readings from Last 3 Encounters:  09/02/19 5' 10.25" (1.784 m)  07/24/18 5\' 10"  (1.778 m)  05/07/18 5' 10.5" (1.791 m)    General appearance: alert, cooperative and appears stated age Head:  Normocephalic, without obvious abnormality, atraumatic Neck: no adenopathy, supple, symmetrical, trachea midline and thyroid normal to inspection and palpation Lungs: clear to auscultation bilaterally Cardiovascular: regular rate and rhythm Breasts: normal appearance, no masses or tenderness Abdomen: soft, non-tender; non distended,  no masses,  no organomegaly Extremities: extremities normal, atraumatic, no cyanosis or edema Skin: Skin color, texture, turgor normal. No rashes or lesions Lymph nodes: Cervical, supraclavicular, and axillary nodes normal. No abnormal inguinal nodes palpated Neurologic: Grossly normal   Pelvic: External genitalia:  no lesions              Urethra:  normal appearing urethra with no masses, tenderness or lesions              Bartholins and Skenes: normal                 Vagina: normal appearing vagina with normal color and discharge, no lesions              Cervix: no lesions               Bimanual Exam:  Uterus:  retroverted, irregular, 10-12 week sized, decreased mobility              Adnexa: no mass, fullness, tenderness               Rectovaginal: Confirms               Anus:  normal sphincter tone, no lesions  Royal Hawthorn chaperoned for the exam.  A:  Well Woman with normal exam  On OCP's to suppress endometriosis  Fibroid uterus, stable    P:   Pap up to date  She will do her labs with her primary  Mammogram overdue #given  She will do her IFOB with her primary  Discussed breast self exam  Discussed calcium and vit D intake  Continue OCP's, will do trial off next year

## 2019-09-02 ENCOUNTER — Other Ambulatory Visit: Payer: Self-pay

## 2019-09-02 ENCOUNTER — Encounter: Payer: Self-pay | Admitting: Obstetrics and Gynecology

## 2019-09-02 ENCOUNTER — Ambulatory Visit (INDEPENDENT_AMBULATORY_CARE_PROVIDER_SITE_OTHER): Payer: No Typology Code available for payment source | Admitting: Obstetrics and Gynecology

## 2019-09-02 VITALS — BP 124/70 | HR 67 | Temp 98.4°F | Ht 70.25 in | Wt 165.4 lb

## 2019-09-02 DIAGNOSIS — Z8742 Personal history of other diseases of the female genital tract: Secondary | ICD-10-CM

## 2019-09-02 DIAGNOSIS — Z3041 Encounter for surveillance of contraceptive pills: Secondary | ICD-10-CM

## 2019-09-02 DIAGNOSIS — Z01419 Encounter for gynecological examination (general) (routine) without abnormal findings: Secondary | ICD-10-CM

## 2019-09-02 DIAGNOSIS — D259 Leiomyoma of uterus, unspecified: Secondary | ICD-10-CM

## 2019-09-02 MED ORDER — NORETHIN ACE-ETH ESTRAD-FE 1.5-30 MG-MCG PO TABS: ORAL_TABLET | ORAL | 3 refills | Status: DC

## 2019-09-02 NOTE — Patient Instructions (Signed)
EXERCISE AND DIET:  We recommended that you start or continue a regular exercise program for good health. Regular exercise means any activity that makes your heart beat faster and makes you sweat.  We recommend exercising at least 30 minutes per day at least 3 days a week, preferably 4 or 5.  We also recommend a diet low in fat and sugar.  Inactivity, poor dietary choices and obesity can cause diabetes, heart attack, stroke, and kidney damage, among others.    ALCOHOL AND SMOKING:  Women should limit their alcohol intake to no more than 7 drinks/beers/glasses of wine (combined, not each!) per week. Moderation of alcohol intake to this level decreases your risk of breast cancer and liver damage. And of course, no recreational drugs are part of a healthy lifestyle.  And absolutely no smoking or even second hand smoke. Most people know smoking can cause heart and lung diseases, but did you know it also contributes to weakening of your bones? Aging of your skin?  Yellowing of your teeth and nails?  CALCIUM AND VITAMIN D:  Adequate intake of calcium and Vitamin D are recommended.  The recommendations for exact amounts of these supplements seem to change often, but generally speaking 1,000 mg of calcium (between diet and supplement) and 800 units of Vitamin D per day seems prudent. Certain women may benefit from higher intake of Vitamin D.  If you are among these women, your doctor will have told you during your visit.    PAP SMEARS:  Pap smears, to check for cervical cancer or precancers,  have traditionally been done yearly, although recent scientific advances have shown that most women can have pap smears less often.  However, every woman still should have a physical exam from her gynecologist every year. It will include a breast check, inspection of the vulva and vagina to check for abnormal growths or skin changes, a visual exam of the cervix, and then an exam to evaluate the size and shape of the uterus and  ovaries.  And after 50 years of age, a rectal exam is indicated to check for rectal cancers. We will also provide age appropriate advice regarding health maintenance, like when you should have certain vaccines, screening for sexually transmitted diseases, bone density testing, colonoscopy, mammograms, etc.   MAMMOGRAMS:  All women over 40 years old should have a yearly mammogram. Many facilities now offer a "3D" mammogram, which may cost around $50 extra out of pocket. If possible,  we recommend you accept the option to have the 3D mammogram performed.  It both reduces the number of women who will be called back for extra views which then turn out to be normal, and it is better than the routine mammogram at detecting truly abnormal areas.    COLON CANCER SCREENING: Now recommend starting at age 45. At this time colonoscopy is not covered for routine screening until 50. There are take home tests that can be done between 45-49.   COLONOSCOPY:  Colonoscopy to screen for colon cancer is recommended for all women at age 50.  We know, you hate the idea of the prep.  We agree, BUT, having colon cancer and not knowing it is worse!!  Colon cancer so often starts as a polyp that can be seen and removed at colonscopy, which can quite literally save your life!  And if your first colonoscopy is normal and you have no family history of colon cancer, most women don't have to have it again for   10 years.  Once every ten years, you can do something that may end up saving your life, right?  We will be happy to help you get it scheduled when you are ready.  Be sure to check your insurance coverage so you understand how much it will cost.  It may be covered as a preventative service at no cost, but you should check your particular policy.      Breast Self-Awareness Breast self-awareness means being familiar with how your breasts look and feel. It involves checking your breasts regularly and reporting any changes to your  health care provider. Practicing breast self-awareness is important. A change in your breasts can be a sign of a serious medical problem. Being familiar with how your breasts look and feel allows you to find any problems early, when treatment is more likely to be successful. All women should practice breast self-awareness, including women who have had breast implants. How to do a breast self-exam One way to learn what is normal for your breasts and whether your breasts are changing is to do a breast self-exam. To do a breast self-exam: Look for Changes  1. Remove all the clothing above your waist. 2. Stand in front of a mirror in a room with good lighting. 3. Put your hands on your hips. 4. Push your hands firmly downward. 5. Compare your breasts in the mirror. Look for differences between them (asymmetry), such as: ? Differences in shape. ? Differences in size. ? Puckers, dips, and bumps in one breast and not the other. 6. Look at each breast for changes in your skin, such as: ? Redness. ? Scaly areas. 7. Look for changes in your nipples, such as: ? Discharge. ? Bleeding. ? Dimpling. ? Redness. ? A change in position. Feel for Changes Carefully feel your breasts for lumps and changes. It is best to do this while lying on your back on the floor and again while sitting or standing in the shower or tub with soapy water on your skin. Feel each breast in the following way:  Place the arm on the side of the breast you are examining above your head.  Feel your breast with the other hand.  Start in the nipple area and make  inch (2 cm) overlapping circles to feel your breast. Use the pads of your three middle fingers to do this. Apply light pressure, then medium pressure, then firm pressure. The light pressure will allow you to feel the tissue closest to the skin. The medium pressure will allow you to feel the tissue that is a little deeper. The firm pressure will allow you to feel the tissue  close to the ribs.  Continue the overlapping circles, moving downward over the breast until you feel your ribs below your breast.  Move one finger-width toward the center of the body. Continue to use the  inch (2 cm) overlapping circles to feel your breast as you move slowly up toward your collarbone.  Continue the up and down exam using all three pressures until you reach your armpit.  Write Down What You Find  Write down what is normal for each breast and any changes that you find. Keep a written record with breast changes or normal findings for each breast. By writing this information down, you do not need to depend only on memory for size, tenderness, or location. Write down where you are in your menstrual cycle, if you are still menstruating. If you are having trouble noticing differences   in your breasts, do not get discouraged. With time you will become more familiar with the variations in your breasts and more comfortable with the exam. How often should I examine my breasts? Examine your breasts every month. If you are breastfeeding, the best time to examine your breasts is after a feeding or after using a breast pump. If you menstruate, the best time to examine your breasts is 5-7 days after your period is over. During your period, your breasts are lumpier, and it may be more difficult to notice changes. When should I see my health care provider? See your health care provider if you notice:  A change in shape or size of your breasts or nipples.  A change in the skin of your breast or nipples, such as a reddened or scaly area.  Unusual discharge from your nipples.  A lump or thick area that was not there before.  Pain in your breasts.  Anything that concerns you.  Perimenopause  Perimenopause is the normal time of life before and after menstrual periods stop completely (menopause). Perimenopause can begin 2-8 years before menopause, and it usually lasts for 1 year after  menopause. During perimenopause, the ovaries may or may not produce an egg. What are the causes? This condition is caused by a natural change in hormone levels that happens as you get older. What increases the risk? This condition is more likely to start at an earlier age if you have certain medical conditions or treatments, including:  A tumor of the pituitary gland in the brain.  A disease that affects the ovaries and hormone production.  Radiation treatment for cancer.  Certain cancer treatments, such as chemotherapy or hormone (anti-estrogen) therapy.  Heavy smoking and excessive alcohol use.  Family history of early menopause. What are the signs or symptoms? Perimenopausal changes affect each woman differently. Symptoms of this condition may include:  Hot flashes.  Night sweats.  Irregular menstrual periods.  Decreased sex drive.  Vaginal dryness.  Headaches.  Mood swings.  Depression.  Memory problems or trouble concentrating.  Irritability.  Tiredness.  Weight gain.  Anxiety.  Trouble getting pregnant. How is this diagnosed? This condition is diagnosed based on your medical history, a physical exam, your age, your menstrual history, and your symptoms. Hormone tests may also be done. How is this treated? In some cases, no treatment is needed. You and your health care provider should make a decision together about whether treatment is necessary. Treatment will be based on your individual condition and preferences. Various treatments are available, such as:  Menopausal hormone therapy (MHT).  Medicines to treat specific symptoms.  Acupuncture.  Vitamin or herbal supplements. Before starting treatment, make sure to let your health care provider know if you have a personal or family history of:  Heart disease.  Breast cancer.  Blood clots.  Diabetes.  Osteoporosis. Follow these instructions at home: Lifestyle  Do not use any products that  contain nicotine or tobacco, such as cigarettes and e-cigarettes. If you need help quitting, ask your health care provider.  Eat a balanced diet that includes fresh fruits and vegetables, whole grains, soybeans, eggs, lean meat, and low-fat dairy.  Get at least 30 minutes of physical activity on 5 or more days each week.  Avoid alcoholic and caffeinated beverages, as well as spicy foods. This may help prevent hot flashes.  Get 7-8 hours of sleep each night.  Dress in layers that can be removed to help you manage hot flashes.  Find ways to manage stress, such as deep breathing, meditation, or journaling. General instructions  Keep track of your menstrual periods, including: ? When they occur. ? How heavy they are and how long they last. ? How much time passes between periods.  Keep track of your symptoms, noting when they start, how often you have them, and how long they last.  Take over-the-counter and prescription medicines only as told by your health care provider.  Take vitamin supplements only as told by your health care provider. These may include calcium, vitamin E, and vitamin D.  Use vaginal lubricants or moisturizers to help with vaginal dryness and improve comfort during sex.  Talk with your health care provider before starting any herbal supplements.  Keep all follow-up visits as told by your health care provider. This is important. This includes any group therapy or counseling. Contact a health care provider if:  You have heavy vaginal bleeding or pass blood clots.  Your period lasts more than 2 days longer than normal.  Your periods are recurring sooner than 21 days.  You bleed after having sex. Get help right away if:  You have chest pain, trouble breathing, or trouble talking.  You have severe depression.  You have pain when you urinate.  You have severe headaches.  You have vision problems. Summary  Perimenopause is the time when a woman's body  begins to move into menopause. This may happen naturally or as a result of other health problems or medical treatments.  Perimenopause can begin 2-8 years before menopause, and it usually lasts for 1 year after menopause.  Perimenopausal symptoms can be managed through medicines, lifestyle changes, and complementary therapies such as acupuncture. This information is not intended to replace advice given to you by your health care provider. Make sure you discuss any questions you have with your health care provider. Document Released: 09/27/2004 Document Revised: 08/02/2017 Document Reviewed: 09/25/2016 Elsevier Patient Education  2020 [employer].

## 2019-09-16 ENCOUNTER — Encounter: Payer: No Typology Code available for payment source | Admitting: Family Medicine

## 2019-11-03 ENCOUNTER — Other Ambulatory Visit: Payer: Self-pay | Admitting: Family Medicine

## 2019-11-03 NOTE — Telephone Encounter (Signed)
Pt has an appt on 3/29. Is it okay to refill?

## 2019-11-27 ENCOUNTER — Encounter: Payer: Self-pay | Admitting: Family Medicine

## 2019-11-30 ENCOUNTER — Encounter: Payer: No Typology Code available for payment source | Admitting: Family Medicine

## 2019-12-06 ENCOUNTER — Ambulatory Visit (INDEPENDENT_AMBULATORY_CARE_PROVIDER_SITE_OTHER)
Admission: RE | Admit: 2019-12-06 | Discharge: 2019-12-06 | Disposition: A | Discharge status: Home / Self Care | Payer: 59 | Source: Ambulatory Visit

## 2019-12-06 DIAGNOSIS — R0602 Shortness of breath: Secondary | ICD-10-CM | POA: Diagnosis not present

## 2019-12-06 DIAGNOSIS — R0981 Nasal congestion: Secondary | ICD-10-CM

## 2019-12-06 DIAGNOSIS — R0789 Other chest pain: Secondary | ICD-10-CM

## 2019-12-06 DIAGNOSIS — R067 Sneezing: Secondary | ICD-10-CM

## 2019-12-06 DIAGNOSIS — J3489 Other specified disorders of nose and nasal sinuses: Secondary | ICD-10-CM

## 2019-12-06 DIAGNOSIS — R05 Cough: Secondary | ICD-10-CM

## 2019-12-06 MED ORDER — FLUTICASONE PROPIONATE 50 MCG/ACT NA SUSP: 2.0000 | Freq: Every day | NASAL | 0 refills | Status: DC

## 2019-12-06 MED ORDER — CETIRIZINE HCL 10 MG PO TABS: 10.0000 mg | ORAL_TABLET | Freq: Every day | ORAL | 0 refills | Status: DC

## 2019-12-06 MED ORDER — ALBUTEROL SULFATE HFA 108 (90 BASE) MCG/ACT IN AERS: 1.0000 | INHALATION_SPRAY | Freq: Four times a day (QID) | RESPIRATORY_TRACT | 0 refills | Status: DC | PRN

## 2019-12-06 NOTE — ED Provider Notes (Signed)
Virtual Visit via Video Note:  Education officer, environmental  initiated request for Telemedicine visit with [facility] team. I connected with Kaylea Carriero  on 12/06/2019 at 2:30 PM  for a synchronized telemedicine visit using a video enabled HIPPA compliant telemedicine application. I verified that I am speaking with [facility]  using two identifiers. Tashari Schoenfelder Jodell Cipro, PA-C  was physically located in a [facility] Urgent care site and [facility] [place] was located at a different location.   The limitations of evaluation and management by telemedicine as well as the availability of in-person appointments were discussed. Patient was informed that she  may incur a bill ( including co-pay) for this virtual visit encounter. [name]  expressed understanding and gave verbal consent to proceed with virtual visit.    History of Present Illness:Victoria Baxter  is a 49 y.o. female presents with 2 days of chest tightness/shortness of breath, rhinorrhea, nasal congestion, cough, sneezing. Symptoms started after moving boxes in the storage unit with more dust exposure. Has had fatigue with intermittent chills. Denies fever. Denies abdominal pain, nausea, vomiting, diarrhea. Denies loss of taste/smell. Chest tightness/shortness of breath has improved since symptom onset, though still present. Has not needed to modify activity due to this. No sick contact. Never smoker  Past Medical History:  Diagnosis Date  . Depression   . Depression   . Endometriosis   . Fibroid     No Known Allergies      Observations/Objective: General: Well appearing, nontoxic, no acute distress. Sitting comfortably. Head: Normocephalic, atraumatic Eye: No conjunctival injection, eyelid swelling. EOMI ENT: Mucus membranes moist, no lip cracking. No obvious nasal drainage. Pulm: Speaking in full sentences without difficulty. Normal effort. No respiratory distress, accessory muscle use. Neuro: Normal mental  status. Alert and oriented x 3.   Assessment and Plan: Discussed cannot rule out COVID causing symptoms. Though given symptom onset shortly after moving boxes/dust exposure, could certainly be due to reactive airway/allergic rhinitis. Discussed COVID testing at this time, or quarantine for a few days with symptomatic management. Patient would like to defer testing at this time. Will start albuterol, flonase, zyrtec as directed. Discussed if symptoms not improving, or develop worsening symptoms, will need in person evaluation, and likely COVID testing. Return precautions given. Patient expresses understanding and agrees to plan.  Follow Up Instructions:    I discussed the assessment and treatment plan with the patient. The patient was provided an opportunity to ask questions and all were answered. The patient agreed with the plan and demonstrated an understanding of the instructions.   The patient was advised to call back or seek an in-person evaluation if the symptoms worsen or if the condition fails to improve as anticipated.  I provided 15 minutes of non-face-to-face time during this encounter.    Ok Edwards, PA-C  12/06/2019 2:30 PM        Ok Edwards, PA-C 12/06/19 1446

## 2019-12-06 NOTE — Discharge Instructions (Signed)
As discussed, cannot rule out COVID causing symptoms. Please quarantine for now. Start albuterol as needed. Flonase/zyrtec as directed. If symptoms not improving, or develop fever, cough, will need COVID testing and in person evaluation. If experiencing significant shortness of breath, trouble breathing, go to the emergency department for further evaluation needed.   If you decide to do COVID testing with drive through--  To make appointment: You can text "COVID" to [phone] OR log on to [email or web address] If no smart phone or PC, you can call [phone] for assistance in setting up appointments.  COVID drive through sites:  [place]: [place]: [place]: [facility]) [place], [place], [place] [address] [phone]  [facility] [address] Evlyn Courier [place], [place] [address] [phone]  [facility] [address] [place], [place] [address] [phone]  [facility] [place], [place], [place],  [address] [phone]  [facility] at [place] [address] [place],  [address] [phone]  [facility] at [address], [place],  [address] [phone]

## 2020-03-07 NOTE — Progress Notes (Signed)
Subjective:    Patient ID: Victoria Baxter, female    DOB: 28-Nov-1970, 49 y.o.   MRN: [id number]  HPI Chief Complaint  Patient presents with  . med check    med check- meds for depression isn't working, PH-9 abnormal   She is here for a medication management visit.  She has a history of depression since 2001 and states her mood has been worsening over the past 9 months. No known triggers.  States she does not understand why she is so depressed.  States in the past her depression has been triggered by break-up's with her relationships but that has not been the case recently. Reports difficulty getting out of bed and doing the things she has enjoyed in the past.   Reports feeling tired. Reports history of IDA and was on oral iron at one point.  Denies anxiety.   States she has suicidal thoughts but this is only been a couple of times and she would never actually follow through and does not have a plan. She denies self-medicating with alcohol or drugs.  Recent vacation in June.  States when she returned from vacation she had cold symptoms.  States she had a negative Covid test.  States her job required that she be off for 2 weeks due to her symptoms. She is searching for a new job because she realized that she is not that happy in her current situation. Reports having right ear pain 2 weeks ago and now she is unable to hear out of her right ear.  History of recurrent ear infections when she was younger.  She has tried several medications in the past. SSRIs did not work for her.  She reports taking Effexor and Wellbutrin daily and these medications were helping significantly however, she does not feel like they are helping her anymore. States she has been looking for a Social worker.  Denies ever seeing a psychiatrist.  LMP: no changes in periods. Takes Junel. endometriosis   States he has her usual headache due to her cycles.   Denies fever, chills, dizziness, chest pain, palpitations,  shortness of breath, cough, abdominal pain, nausea, vomiting or diarrhea.    Depression screen [facility] 2/9 03/08/2020 12/03/2018 05/01/2018 04/07/2018  Decreased Interest 3 0 1 2  Down, Depressed, Hopeless 3 0 0 2  PHQ - 2 Score 6 0 1 4  Altered sleeping 3 - - 3  Tired, decreased energy 3 - - 3  Change in appetite 3 - - 3  Feeling bad or failure about yourself  3 - - 3  Trouble concentrating 3 - - 3  Moving slowly or fidgety/restless 3 - - 3  Suicidal thoughts 1 - - 3  PHQ-9 Score 25 - - 25  Difficult doing work/chores Somewhat difficult - - Somewhat difficult       Review of Systems Pertinent positives and negatives in the history of present illness.     Objective:   Physical Exam BP 120/64   Pulse 91   Wt 168 lb (76.2 kg)   BMI 23.93 kg/m   Alert and in no distress.  Left ear canal is patent, right TM is injected and retracted.  Decreased hearing.  Right tympanic membrane and canal is normal. Neck is supple without adenopathy or thyromegaly. Cardiac exam shows a regular sinus rhythm without murmurs or gallops. Lungs are clear to auscultation.  Skin is warm and dry.  Patellar reflexes are exaggerated, no clonus. Mood is somewhat anxious.  Assessment & Plan:  Depression, recurrent ([name]) -Recommend checking labs to look for any underlying physiological explanation for depression and fatigue.  Her mood is somewhat anxious today.  Reports taking Effexor and Wellbutrin daily.  States she normally feels like the medications are helping.  She has thoughts of suicide but no plan or intention and states she would not actually harm herself.  She denied developed a verbal contract that she would not harm herself. Discussed essentially increasing her Effexor.  She has tried SSRIs in the past and they did not work.  She is in agreement for referral to psychiatry and counseling.  Medication management  Fatigue, unspecified type - Plan: CBC with Differential/Platelet, Comprehensive metabolic  panel, TSH, T4, free, T3, VITAMIN D 25 Hydroxy (Vit-D Deficiency, Fractures), Vitamin B12, Iron, TIBC and Ferritin Panel -Check labs and check for an underlying physiological explanation.   Acute otitis media, right - Plan: amoxicillin (AMOXIL) 875 MG tablet -treat with an antibiotic and follow up if not back to baseline.   Hyperreflexia - Plan: TSH, T4, free, T3 -check labs and follow up   History of iron deficiency anemia - Plan: CBC with Differential/Platelet, Vitamin B12, Iron, TIBC and Ferritin Panel -check labs and start on iron as appropriate. She will follow up in August for CPE

## 2020-03-08 ENCOUNTER — Ambulatory Visit: Payer: No Typology Code available for payment source | Admitting: Family Medicine

## 2020-03-08 ENCOUNTER — Encounter: Payer: Self-pay | Admitting: Family Medicine

## 2020-03-08 ENCOUNTER — Other Ambulatory Visit: Payer: Self-pay

## 2020-03-08 VITALS — BP 120/64 | HR 91 | Wt 168.0 lb

## 2020-03-08 DIAGNOSIS — H6691 Otitis media, unspecified, right ear: Secondary | ICD-10-CM | POA: Diagnosis not present

## 2020-03-08 DIAGNOSIS — Z862 Personal history of diseases of the blood and blood-forming organs and certain disorders involving the immune mechanism: Secondary | ICD-10-CM

## 2020-03-08 DIAGNOSIS — R292 Abnormal reflex: Secondary | ICD-10-CM

## 2020-03-08 DIAGNOSIS — Z79899 Other long term (current) drug therapy: Secondary | ICD-10-CM | POA: Diagnosis not present

## 2020-03-08 DIAGNOSIS — R5383 Other fatigue: Secondary | ICD-10-CM | POA: Diagnosis not present

## 2020-03-08 DIAGNOSIS — F339 Major depressive disorder, recurrent, unspecified: Secondary | ICD-10-CM | POA: Diagnosis not present

## 2020-03-08 MED ORDER — AMOXICILLIN 875 MG PO TABS: 875.0000 mg | ORAL_TABLET | Freq: Two times a day (BID) | ORAL | 0 refills | Status: DC

## 2020-03-08 NOTE — Patient Instructions (Signed)
Take the antibiotic for your ear infection.   You can call to schedule your appointment with the psychiatrist/counselor. A few offices are listed below for you to call.      [facility]  [place], [place], [place] [address]  Phone: [phone]   [place], [place] [address]  Phone: [place]  Ask for a psychiatrist  [place]  (across from [facility])  [place] [facility] [address] [place] Dandy [place], [place] [address] [phone]

## 2020-03-09 ENCOUNTER — Encounter: Payer: Self-pay | Admitting: Family Medicine

## 2020-03-09 LAB — COMPREHENSIVE METABOLIC PANEL
ALT: 47 IU/L — ABNORMAL HIGH (ref 0–32)
AST: 29 IU/L (ref 0–40)
Albumin/Globulin Ratio: 1.6 (ref 1.2–2.2)
Albumin: 4.6 g/dL (ref 3.8–4.8)
Alkaline Phosphatase: 149 IU/L — ABNORMAL HIGH (ref 48–121)
BUN/Creatinine Ratio: 21 (ref 9–23)
BUN: 21 mg/dL (ref 6–24)
Bilirubin Total: 0.3 mg/dL (ref 0.0–1.2)
CO2: 23 mmol/L (ref 20–29)
Calcium: 10.5 mg/dL — ABNORMAL HIGH (ref 8.7–10.2)
Chloride: 102 mmol/L (ref 96–106)
Creatinine, Ser: 1 mg/dL (ref 0.57–1.00)
GFR calc Af Amer: 76 mL/min/{1.73_m2} (ref >59)
GFR calc non Af Amer: 66 mL/min/{1.73_m2} (ref >59)
Globulin, Total: 2.8 g/dL (ref 1.5–4.5)
Glucose: 98 mg/dL (ref 65–99)
Potassium: 5.3 mmol/L — ABNORMAL HIGH (ref 3.5–5.2)
Sodium: 139 mmol/L (ref 134–144)
Total Protein: 7.4 g/dL (ref 6.0–8.5)

## 2020-03-09 LAB — CBC WITH DIFFERENTIAL/PLATELET
Basophils Absolute: 0.1 10*3/uL (ref 0.0–0.2)
Basos: 1 %
EOS (ABSOLUTE): 0.3 10*3/uL (ref 0.0–0.4)
Eos: 4 %
Hematocrit: 51.9 % — ABNORMAL HIGH (ref 34.0–46.6)
Hemoglobin: 17.2 g/dL — ABNORMAL HIGH (ref 11.1–15.9)
Immature Grans (Abs): 0.1 10*3/uL (ref 0.0–0.1)
Immature Granulocytes: 1 %
Lymphocytes Absolute: 2.7 10*3/uL (ref 0.7–3.1)
Lymphs: 32 %
MCH: 30.8 pg (ref 26.6–33.0)
MCHC: 33.1 g/dL (ref 31.5–35.7)
MCV: 93 fL (ref 79–97)
Monocytes Absolute: 0.4 10*3/uL (ref 0.1–0.9)
Monocytes: 5 %
Neutrophils Absolute: 4.9 10*3/uL (ref 1.4–7.0)
Neutrophils: 57 %
Platelets: 394 10*3/uL (ref 150–450)
RBC: 5.59 x10E6/uL — ABNORMAL HIGH (ref 3.77–5.28)
RDW: 11.9 % (ref 11.7–15.4)
WBC: 8.5 10*3/uL (ref 3.4–10.8)

## 2020-03-09 LAB — IRON,TIBC AND FERRITIN PANEL
Ferritin: 281 ng/mL — ABNORMAL HIGH (ref 15–150)
Iron Saturation: 26 % (ref 15–55)
Iron: 92 ug/dL (ref 27–159)
Total Iron Binding Capacity: 353 ug/dL (ref 250–450)
UIBC: 261 ug/dL (ref 131–425)

## 2020-03-09 LAB — VITAMIN D 25 HYDROXY (VIT D DEFICIENCY, FRACTURES): Vit D, 25-Hydroxy: 38.2 ng/mL (ref 30.0–100.0)

## 2020-03-09 LAB — TSH: TSH: 2.42 u[IU]/mL (ref 0.450–4.500)

## 2020-03-09 LAB — T4, FREE: Free T4: 1.27 ng/dL (ref 0.82–1.77)

## 2020-03-09 LAB — T3: T3, Total: 161 ng/dL (ref 71–180)

## 2020-03-09 LAB — VITAMIN B12: Vitamin B-12: 764 pg/mL (ref 232–1245)

## 2020-03-09 NOTE — Progress Notes (Signed)
Please ask Victoria Baxter to add an acute hepatitis panel due to elevated liver function tests.

## 2020-03-09 NOTE — Progress Notes (Signed)
Needs office visit follow up on abnormal labs next week with me please.

## 2020-03-10 ENCOUNTER — Ambulatory Visit: Payer: No Typology Code available for payment source | Admitting: Family Medicine

## 2020-03-13 ENCOUNTER — Other Ambulatory Visit: Payer: Self-pay | Admitting: Family Medicine

## 2020-03-13 MED ORDER — VENLAFAXINE HCL ER 150 MG PO CP24: 150.0000 mg | ORAL_CAPSULE | Freq: Every day | ORAL | 0 refills | Status: DC

## 2020-03-16 NOTE — Progress Notes (Signed)
   Subjective:    Patient ID: Laurann Montana, female    DOB: [birth year], 49 y.o.   MRN: [id number]  HPI Chief Complaint  Patient presents with  . follow-uip    follow-up on labs    She is here for follow-up on abnormal labs however, complains of worsening dry cough since her last visit.  States over the past 2 days her cough has become quite bothersome.  She is taking over-the-counter cough medication.  She is taking an antibiotic for acute otitis media of the right ear diagnosed last week.  States she had a negative Covid test. Denies fever, chills, rhinorrhea, nasal congestion, sore throat, abdominal pain, nausea, vomiting, diarrhea. Denies chest pain, palpitations, shortness of breath, wheezing. States approximately every 3 years she gets a bad cough that takes weeks to resolve.  Abnormal labs including polycythemia with elevated HCT.   Elevated serum calcium.   Elevated ALT and alk phos   Depression-she was seen last week for worsening depression.  We increased Effexor a couple of days ago but she has not started on the new dose. Counseling appt scheduled on 04/07/2020  States she forgot to mention that she had bad diarrhea for a few days before her visit last week and the lab draw.  Diarrhea resolved.  Reviewed allergies, medications, past medical, surgical, family, and social history.   Review of Systems Pertinent positives and negatives in the history of present illness.     Objective:   Physical Exam BP 120/80   Pulse 78   Temp 98.1 F (36.7 C)   Wt 168 lb 12.8 oz (76.6 kg)   SpO2 99%   BMI 24.05 kg/m   Alert and in no distress.  Cardiac exam shows a regular sinus rhythm without murmurs or gallops. Lungs are clear to auscultation.        Assessment & Plan:  Polycythemia - Plan: POCT Urinalysis DIP (Proadvantage Device), CBC with Differential/Platelet, Comprehensive metabolic panel -Recheck CBC.  She revealed today that she was most likely dehydrated when  she had her labs checked last week due to several days of diarrhea.  Diarrhea has resolved.  Her blood could have been concentrated. Urinalysis dipstick negative today.  Serum calcium elevated - Plan: Comprehensive metabolic panel -Recheck and follow-up  Elevated alkaline phosphatase level - Plan: Comprehensive metabolic panel -Recheck liver function and follow-up  Cough present for greater than 3 weeks - Plan: benzonatate (TESSALON) 200 MG capsule, DG Chest 2 View, predniSONE (STERAPRED UNI-PAK 21 TAB) 10 MG (21) TBPK tablet -She is coughing throughout the visit today so we needed to address this.  I recommend a chest x-ray, steroids and Tessalon prescribed.  She may continue taking over-the-counter cough medication.  She may want to try Mucinex DM.  Recommend increasing water intake.  Today is her last day of amoxicillin and this did not help her cough at all.  Follow-up pending x-ray.  She will follow-up if worsening or not back to baseline after completing the oral steroids.  We did discuss potential side effects from oral steroids as well

## 2020-03-17 ENCOUNTER — Other Ambulatory Visit: Payer: Self-pay

## 2020-03-17 ENCOUNTER — Ambulatory Visit: Payer: No Typology Code available for payment source | Admitting: Family Medicine

## 2020-03-17 ENCOUNTER — Ambulatory Visit
Admission: RE | Admit: 2020-03-17 | Discharge: 2020-03-17 | Disposition: A | Discharge status: Home / Self Care | Payer: No Typology Code available for payment source | Source: Ambulatory Visit | Attending: Family Medicine | Admitting: Family Medicine

## 2020-03-17 ENCOUNTER — Encounter: Payer: Self-pay | Admitting: Family Medicine

## 2020-03-17 VITALS — BP 120/80 | HR 78 | Temp 98.1°F | Wt 168.8 lb

## 2020-03-17 DIAGNOSIS — R058 Other specified cough: Secondary | ICD-10-CM

## 2020-03-17 DIAGNOSIS — D751 Secondary polycythemia: Secondary | ICD-10-CM | POA: Diagnosis not present

## 2020-03-17 DIAGNOSIS — R05 Cough: Secondary | ICD-10-CM

## 2020-03-17 DIAGNOSIS — R748 Abnormal levels of other serum enzymes: Secondary | ICD-10-CM | POA: Diagnosis not present

## 2020-03-17 LAB — POCT URINALYSIS DIP (PROADVANTAGE DEVICE)
Bilirubin, UA: NEGATIVE
Blood, UA: NEGATIVE
Glucose, UA: NEGATIVE mg/dL
Ketones, POC UA: NEGATIVE mg/dL
Leukocytes, UA: NEGATIVE
Nitrite, UA: NEGATIVE
Protein Ur, POC: NEGATIVE mg/dL
Specific Gravity, Urine: 1.025
Urobilinogen, Ur: NEGATIVE
pH, UA: 6 (ref 5.0–8.0)

## 2020-03-17 MED ORDER — PREDNISONE 10 MG (21) PO TBPK: ORAL_TABLET | Freq: Every day | ORAL | 0 refills | Status: DC

## 2020-03-17 MED ORDER — BENZONATATE 200 MG PO CAPS: 200.0000 mg | ORAL_CAPSULE | Freq: Two times a day (BID) | ORAL | 0 refills | Status: DC | PRN

## 2020-03-18 ENCOUNTER — Encounter: Payer: Self-pay | Admitting: Family Medicine

## 2020-03-18 ENCOUNTER — Other Ambulatory Visit: Payer: Self-pay

## 2020-03-18 DIAGNOSIS — E875 Hyperkalemia: Secondary | ICD-10-CM

## 2020-03-18 LAB — CBC WITH DIFFERENTIAL/PLATELET
Basophils Absolute: 0.1 10*3/uL (ref 0.0–0.2)
Basos: 1 %
EOS (ABSOLUTE): 0.2 10*3/uL (ref 0.0–0.4)
Eos: 2 %
Hematocrit: 46.2 % (ref 34.0–46.6)
Hemoglobin: 15.9 g/dL (ref 11.1–15.9)
Immature Grans (Abs): 0 10*3/uL (ref 0.0–0.1)
Immature Granulocytes: 0 %
Lymphocytes Absolute: 2.3 10*3/uL (ref 0.7–3.1)
Lymphs: 38 %
MCH: 31.7 pg (ref 26.6–33.0)
MCHC: 34.4 g/dL (ref 31.5–35.7)
MCV: 92 fL (ref 79–97)
Monocytes Absolute: 0.3 10*3/uL (ref 0.1–0.9)
Monocytes: 4 %
Neutrophils Absolute: 3.3 10*3/uL (ref 1.4–7.0)
Neutrophils: 55 %
Platelets: 334 10*3/uL (ref 150–450)
RBC: 5.01 x10E6/uL (ref 3.77–5.28)
RDW: 12 % (ref 11.7–15.4)
WBC: 6.2 10*3/uL (ref 3.4–10.8)

## 2020-03-18 LAB — COMPREHENSIVE METABOLIC PANEL
ALT: 21 IU/L (ref 0–32)
AST: 18 IU/L (ref 0–40)
Albumin/Globulin Ratio: 1.9 (ref 1.2–2.2)
Albumin: 4.3 g/dL (ref 3.8–4.8)
Alkaline Phosphatase: 102 IU/L (ref 48–121)
BUN/Creatinine Ratio: 16 (ref 9–23)
BUN: 14 mg/dL (ref 6–24)
Bilirubin Total: 0.7 mg/dL (ref 0.0–1.2)
CO2: 22 mmol/L (ref 20–29)
Calcium: 9.6 mg/dL (ref 8.7–10.2)
Chloride: 105 mmol/L (ref 96–106)
Creatinine, Ser: 0.87 mg/dL (ref 0.57–1.00)
GFR calc Af Amer: 90 mL/min/{1.73_m2} (ref >59)
GFR calc non Af Amer: 78 mL/min/{1.73_m2} (ref >59)
Globulin, Total: 2.3 g/dL (ref 1.5–4.5)
Glucose: 90 mg/dL (ref 65–99)
Potassium: 5.7 mmol/L — ABNORMAL HIGH (ref 3.5–5.2)
Sodium: 141 mmol/L (ref 134–144)
Total Protein: 6.6 g/dL (ref 6.0–8.5)

## 2020-03-18 NOTE — Progress Notes (Signed)
Thanks. Let's get her in Monday for repeat labs, BMP for hyperkalemia. Please have one of the other providers address her result. Hoping her potassium is lower.

## 2020-03-18 NOTE — Progress Notes (Signed)
Her potassium level is higher and we need to figure out why and get this number down. Is she taking anything over the counter with potassium in it? Is she using "NO SALT" which is a salt substitute? Is she drinking or eating anything which is high in potassium? Protein shakes can sometimes have a lot of potassium, I think she may have said something about this. She should drink plenty of water (64 ounces throughout the day) and return Monday morning for a repeat BMP due to hyperkalemia. One of the other providers will need to follow up on this since I will not be available. Thanks.

## 2020-03-23 LAB — HEPATITIS PANEL, ACUTE
Hep A IgM: NEGATIVE
Hep B C IgM: NEGATIVE
Hep C Virus Ab: <0.1 s/co ratio (ref 0.0–0.9)
Hepatitis B Surface Ag: NEGATIVE

## 2020-03-23 LAB — SPECIMEN STATUS REPORT

## 2020-03-31 ENCOUNTER — Other Ambulatory Visit: Payer: Self-pay

## 2020-03-31 ENCOUNTER — Other Ambulatory Visit: Payer: No Typology Code available for payment source

## 2020-03-31 DIAGNOSIS — E875 Hyperkalemia: Secondary | ICD-10-CM

## 2020-04-04 LAB — BASIC METABOLIC PANEL
BUN/Creatinine Ratio: 20 (ref 9–23)
BUN: 18 mg/dL (ref 6–24)
CO2: 22 mmol/L (ref 20–29)
Calcium: 9.2 mg/dL (ref 8.7–10.2)
Chloride: 108 mmol/L — ABNORMAL HIGH (ref 96–106)
Creatinine, Ser: 0.88 mg/dL (ref 0.57–1.00)
GFR calc Af Amer: 89 mL/min/{1.73_m2} (ref >59)
GFR calc non Af Amer: 77 mL/min/{1.73_m2} (ref >59)
Glucose: 98 mg/dL (ref 65–99)
Potassium: 4.6 mmol/L (ref 3.5–5.2)
Sodium: 143 mmol/L (ref 134–144)

## 2020-04-07 ENCOUNTER — Ambulatory Visit (INDEPENDENT_AMBULATORY_CARE_PROVIDER_SITE_OTHER): Payer: No Typology Code available for payment source | Admitting: Psychiatry

## 2020-04-07 ENCOUNTER — Encounter: Payer: Self-pay | Admitting: Family Medicine

## 2020-04-07 ENCOUNTER — Encounter: Payer: Self-pay | Admitting: Psychiatry

## 2020-04-07 VITALS — BP 123/80 | HR 80 | Ht 70.0 in | Wt 160.0 lb

## 2020-04-07 DIAGNOSIS — F411 Generalized anxiety disorder: Secondary | ICD-10-CM

## 2020-04-07 DIAGNOSIS — F332 Major depressive disorder, recurrent severe without psychotic features: Secondary | ICD-10-CM

## 2020-04-07 MED ORDER — ARIPIPRAZOLE 5 MG PO TABS: 5.0000 mg | ORAL_TABLET | Freq: Every day | ORAL | 0 refills | Status: DC

## 2020-04-07 MED ORDER — VENLAFAXINE HCL ER 37.5 MG PO CP24: 112.5000 mg | ORAL_CAPSULE | Freq: Every day | ORAL | 0 refills | Status: DC

## 2020-04-07 MED ORDER — BUPROPION HCL ER (XL) 150 MG PO TB24: 150.0000 mg | ORAL_TABLET | Freq: Every day | ORAL | 1 refills | Status: DC

## 2020-04-07 NOTE — Progress Notes (Signed)
[facility] MD/PA/NP Initial Note  04/08/2020 11:28 AM Victoria Baxter  MRN:  [id number]  Chief Complaint:  Chief Complaint    Depression; Anxiety      HPI: Patient is a 49 year old female being seen for initial evaluation for depression and history of anxiety. She reports that she has had periods of depression and periods of remission since 2002.  She reports that she is uncertain if she had experienced any depression prior to 2002.  She reports that current depressive episode started over 2 years ago.  She started [facility] yesterday with [facility].   She reports persistent sad mood. She reports "I'm just tired," both emotionally and physically. Motivation has been very low.  Reports falling behind on different chores and tasks and has not mowed her grass in a month and has dishes piled up. She reports that she has diminished interest in things. Anhedonia. She reports excessive sleep and estimates sleeping 10-12 hours on the days that she does not work. Appetite is ok. Reports that appetite was increased and gained weight last year. She reports difficulty with concentration.  She reports episodes of irritability and frustration. Reports that she has been written up twice at work for difficulty controlling frustration. She tries to avoid situations that may trigger anger. Has periods of tearfulness when frustrated or overwhelmed. She reports chronic passive death wishes. Denies suicidal intent or plan. Denies any past suicide attempts.   Denies any panic attacks. Reports that at times in the past she may have had headaches and upset stomach with anxiety. She reports that she has always been an over-thinker and has been concerned about whether people like her. She reports catastrophic thinking. She reports that her thoughts are always going. She reports some perfectionistic tendencies and will re-check her work and think about how it could be perceived. She reports that she avoids crowds and  social situations. Denies anxiety with public speaking as long as the subject is not about her. Describes herself as introverted.  She reports rumination in the past.  She reports sensitivity to sounds, textures, lights. She reports that she has had some OCD tendencies with needing things straight and in order. Denies any compulsions.   Denies any past manic s/s.   Noticed change in mood with change in OCP. She reports having headaches and migraines around the week of her menstrual cycle.   From [place] and lived there 18 years. Reports that mother was very critical and her mood changed periodically. Mother was school Pharmacist, hospital. Reports that father was "low-key." Pt is the oldets of 5 children. She reports that siblings were in 2 sets and that her youngest sibling is 54 years younger. She reports that she was told that she was "the sensitive one." Completed bachelor's degree. Never married. Not currently in a relationship. No children. Had to put dog down last year. Currently fostering 4 kittens. Lived in [place] since 2000. Dx'd with endometriosis 2001. Depression dx'd 2002 and started therapy. Also had a breakup in 2002. Moved to [place] 2 years ago. Reports that July 2019 was a difficult month for her. Was involved with a church in [place] and has not been active in a church here. Has enjoyed art work in the past but has not done this recently. Works as a Chiropodist at [employer] She reports a supportive friend, Bethena Roys, in [place]. Has a friend, Almyra Free, locally.   Past Psychiatric Medication Trials: Zoloft- Took 50 mg from 4/02-2/03 Pamelor- Took 10 mg  3/03-9/03 Celexa- Took 20 mg 9/03-10/03. Increased up to 40 mg qd Prozac- 20 mg 11/03 Lexapro- 20 mg 10/12-5/16. Initially effective and then no longer as effective.  Wellbutrin XL 150 mg- Has taken from June 2016-present. Notices increased mind racing when she does not take it.  Effexor XR- Took 75 mg June 2016. Increased by PCP to 150  mg po qd and this caused palpitations and insomnia. Has been somewhat helpful.  Pristiq Concerta- Worsening concentration and possible dissociation. Found herself driving in the middle of the road  May have taken Paxil   Visit Diagnosis:    ICD-10-CM   1. Severe episode of recurrent major depressive disorder, without psychotic features (HCC)  F33.2 ARIPiprazole (ABILIFY) 5 MG tablet    buPROPion (WELLBUTRIN XL) 150 MG 24 hr tablet    venlafaxine XR (EFFEXOR-XR) 37.5 MG 24 hr capsule  2. Generalized anxiety disorder  F41.1     Past Psychiatric History: Has seen a therapist in [place]. Has not seen a therapist in the last couple of years. Saw a psychiatrist in [place]. Started [facility] yesterday. December 2004 was in psychiatric day treatment for 2 weeks. Denies any inpatient psychiatric treatment.   Past Medical History:  Past Medical History:  Diagnosis Date  . Depression   . Depression   . Endometriosis   . Fibroid     Past Surgical History:  Procedure Laterality Date  . DILATION AND CURETTAGE OF UTERUS    . LAPAROSCOPY ABDOMEN DIAGNOSTIC      Family History:  Family History  Problem Relation Age of Onset  . Uterine cancer Mother 3  . Other Mother        brain tumor 2001  . Basal cell carcinoma Father   . Prostate cancer Father   . Other Father        sarcomatoid carcinoma  . Breast cancer Maternal Aunt   . Cancer Paternal Aunt   . Diabetes Paternal Grandmother   . Depression Maternal Grandmother   . Anxiety disorder Brother     Social History:  Social History   Socioeconomic History  . Marital status: Single    Spouse name: Not on file  . Number of children: Not on file  . Years of education: Not on file  . Highest education level: Not on file  Occupational History  . Not on file  Tobacco Use  . Smoking status: Never Smoker  . Smokeless tobacco: Never Used  Vaping Use  . Vaping Use: Never used  Substance and Sexual Activity  . Alcohol use: Yes     Comment: once every other month  . Drug use: Never  . Sexual activity: Not Currently    Birth control/protection: Pill  Other Topics Concern  . Not on file  Social History Narrative  . Not on file   Social Determinants of Health   Financial Resource Strain:   . Difficulty of Paying Living Expenses:   Food Insecurity:   . Worried About Charity fundraiser in the Last Year:   . Arboriculturist in the Last Year:   Transportation Needs:   . Film/video editor (Medical):   Marland Kitchen Lack of Transportation (Non-Medical):   Physical Activity:   . Days of Exercise per Week:   . Minutes of Exercise per Session:   Stress:   . Feeling of Stress :   Social Connections:   . Frequency of Communication with Friends and Family:   . Frequency of Social Gatherings with Friends  and Family:   . Attends Religious Services:   . Active Member of Clubs or Organizations:   . Attends Archivist Meetings:   Marland Kitchen Marital Status:     Allergies: No Known Allergies  Metabolic Disorder Labs: No results found for: HGBA1C, MPG No results found for: PROLACTIN Lab Results  Component Value Date   CHOL 231 (H) 05/01/2018   TRIG 135 05/01/2018   HDL 63 05/01/2018   CHOLHDL 3.7 05/01/2018   LDLCALC 141 (H) 05/01/2018   Lab Results  Component Value Date   TSH 2.420 03/08/2020   TSH 1.350 05/01/2018    Therapeutic Level Labs: No results found for: LITHIUM No results found for: VALPROATE No components found for:  CBMZ  Current Medications: Current Outpatient Medications  Medication Sig Dispense Refill  . buPROPion (WELLBUTRIN XL) 150 MG 24 hr tablet Take 1 tablet (150 mg total) by mouth daily. 90 tablet 1  . Cholecalciferol (VITAMIN D PO) Take by mouth daily.     . Multiple Vitamin (MULTIVITAMIN) tablet Take 1 tablet by mouth daily.    . NON FORMULARY 700 mg. [place]     . norethindrone-ethinyl estradiol-iron (JUNEL FE 1.5/30) 1.5-30 MG-MCG tablet TAKE 1 TABLET DAILY TAKE   CONTINUOUSLY FOR  2-3 MONTHSIN A ROW. 4 Package 3  . ARIPiprazole (ABILIFY) 5 MG tablet Take 1 tablet (5 mg total) by mouth daily. 30 tablet 0  . venlafaxine XR (EFFEXOR-XR) 37.5 MG 24 hr capsule Take 3 capsules (112.5 mg total) by mouth daily with breakfast. 270 capsule 0   No current facility-administered medications for this visit.    Medication Side Effects: none  Orders placed this visit:  No orders of the defined types were placed in this encounter.   Psychiatric Specialty Exam:  Review of Systems  Constitutional: Positive for fatigue.  HENT: Negative.   Eyes: Negative.   Respiratory: Negative.   Cardiovascular: Negative.   Gastrointestinal: Negative.   Endocrine: Negative.   Genitourinary: Negative.   Musculoskeletal: Negative.   Skin: Negative.   Allergic/Immunologic: Negative.   Neurological: Negative.   Hematological: Negative.   Psychiatric/Behavioral:       Please refer to HPI    Blood pressure 123/80, pulse 80, height 5\' 10"  (1.778 m), weight 160 lb (72.6 kg).Body mass index is 22.96 kg/m.  General Appearance: Casual  Eye Contact:  Good  Speech:  Clear and Coherent and Normal Rate  Volume:  Normal  Mood:  Depressed  Affect:  Congruent, Depressed and Full Range  Thought Process:  Coherent, Goal Directed, Linear and Descriptions of Associations: Intact  Orientation:  Full (Time, Place, and Person)  Thought Content: Logical, Hallucinations: None and Rumination   Suicidal Thoughts:  No  Homicidal Thoughts:  No  Memory:  WNL  Judgement:  Good  Insight:  Good  Psychomotor Activity:  Decreased  Concentration:  Concentration: Good and Attention Span: Good  Recall:  Good  Fund of Knowledge: Good  Language: Good  Assets:  Communication Skills Desire for Improvement Resilience Social Support  ADL's:  Intact  Cognition: WNL  Prognosis:  Good   Screenings:  PHQ2-9     Clinical Support from 03/08/2020 in [facility] from 12/03/2018 in [facility] Visit from 05/01/2018 in [facility] Visit from 04/07/2018 in [facility]  PHQ-2 Total Score 6 0 1 4  PHQ-9 Total Score 25 -- -- 25      Receiving Psychotherapy: No   Treatment Plan/Recommendations:  Patient seen for 60 minutes and time spent counseling patient regarding possible treatment options for depression and reviewing different types of medications used to treat depression and their proposed mechanism of action.  Discussed changing Effexor XR to three 37.5 mg capsules to equal total daily dose of 112.5 mg since patient reports partial response with 75 mg daily and tolerability issues with 150 mg daily and has therefore been attempting to open capsules and take dose between 75 and 150 mg.  Discussed potential benefits, risks, and side effects of Abilify and for augmentation of depression since patient has not had a trial with an atypical antipsychotic. Discussed potential metabolic side effects associated with atypical antipsychotics, as well as potential risk for movement side effects. Advised pt to contact office if movement side effects occur.  Patient agrees to trial of Abilify 5 mg daily.  Will continue Wellbutrin XL 150 mg daily since patient reports that Wellbutrin XL may be partially effective for her depressive signs and symptoms.  Will not increase dose at this time since patient reports experiencing some irritability and higher doses of Wellbutrin XL could potentially cause increased activation and worsening irritability.  Recommend continuing [facility] treatments as recommended by [facility] providers.  Discussed considering resuming therapy with a therapist in this area wants patient experiences some improvement in depressive signs and symptoms and feels that she would be better able to engage in therapy.  Information release obtained at time of exam in order to request medical records from previous psychiatric provider for continuation of treatment.   Patient to follow-up with this provider in 4 weeks or sooner if clinically indicated. Patient advised to contact office with any questions, adverse effects, or acute worsening in signs and symptoms.     Thayer Headings, PMHNP

## 2020-04-12 ENCOUNTER — Encounter: Payer: Self-pay | Admitting: Family

## 2020-04-12 ENCOUNTER — Ambulatory Visit: Payer: No Typology Code available for payment source | Admitting: Family

## 2020-04-12 VITALS — Ht 70.0 in | Wt 160.0 lb

## 2020-04-12 DIAGNOSIS — S92302A Fracture of unspecified metatarsal bone(s), left foot, initial encounter for closed fracture: Secondary | ICD-10-CM

## 2020-04-12 NOTE — Progress Notes (Signed)
Office Visit Note   Patient: Victoria Baxter           Date of Birth: [birth year]           MRN: [id number] Visit Date: 04/12/2020              Requested by: Girtha Rm, NP-C [place],  [place] [id number] PCP: Girtha Rm, NP-C  Chief Complaint  Patient presents with  . Left Foot - Pain      HPI: Patient is a 49 year old woman who presents today for intial evaluation of left foot pain.  She states that she was jumping up for a smoke detector that was alarming in the night. Was seen at [facility] urgent care and radiographs were performed.  She has been nonweightbearing using crutches and an Ace wrap.  Continues to have swelling and bruising over the lateral column  Assessment & Plan: Visit Diagnoses:  1. Closed fracture of shaft of metatarsal bone of left foot, initial encounter     Plan: Placed in a postop shoe advised to continue to nonweightbearing of the left foot.  Provided an out of work note for the next 6 weeks.  She will follow-up in 2 weeks radiographs of the left foot  Follow-Up Instructions: Return in about 2 weeks (around 04/26/2020).   Ortho Exam  Patient is alert, oriented, no adenopathy, well-dressed, normal affect, normal respiratory effort. On examination of the left foot she has a good dorsalis pedis pulse.  Mild to moderate edema.  She does have a ecchymosis along the lateral column and over the toes.  Imaging: No results found. No images are attached to the encounter.  Labs: No results found for: HGBA1C, ESRSEDRATE, CRP, LABURIC, REPTSTATUS, GRAMSTAIN, CULT, LABORGA   Lab Results  Component Value Date   ALBUMIN 4.3 03/17/2020   ALBUMIN 4.6 03/08/2020   ALBUMIN 4.6 05/01/2018    No results found for: MG Lab Results  Component Value Date   VD25OH 38.2 03/08/2020    No results found for: PREALBUMIN CBC EXTENDED Latest Ref Rng & Units 03/17/2020 03/08/2020 05/01/2018  WBC 3.4 - 10.8 x10E3/uL 6.2 8.5 6.7  RBC 3.77 - 5.28  x10E6/uL 5.01 5.59(H) 5.17  HGB 11.1 - 15.9 g/dL 15.9 17.2(H) 16.2(H)  HCT 34.0 - 46.6 % 46.2 51.9(H) 46.5  PLT 150 - 450 x10E3/uL 334 394 329  NEUTROABS 1 - 7 x10E3/uL 3.3 4.9 3.4  LYMPHSABS 0 - 3 x10E3/uL 2.3 2.7 2.8     Body mass index is 22.96 kg/m.  Orders:  No orders of the defined types were placed in this encounter.  No orders of the defined types were placed in this encounter.    Procedures: No procedures performed  Clinical Data: No additional findings.  ROS:  All other systems negative, except as noted in the HPI. Review of Systems  Objective: Vital Signs: Ht 5\' 10"  (1.778 m)   Wt 160 lb (72.6 kg)   BMI 22.96 kg/m   Specialty Comments:  No specialty comments available.  PMFS History: Patient Active Problem List   Diagnosis Date Noted  . Depression 04/07/2018  . Fatigue 04/07/2018  . Chronic nonintractable headache 04/07/2018  . Endometriosis    Past Medical History:  Diagnosis Date  . Depression   . Depression   . Endometriosis   . Fibroid     Family History  Problem Relation Age of Onset  . Uterine cancer Mother 30  . Other Mother  brain tumor 2001  . Basal cell carcinoma Father   . Prostate cancer Father   . Other Father        sarcomatoid carcinoma  . Breast cancer Maternal Aunt   . Cancer Paternal Aunt   . Diabetes Paternal Grandmother   . Depression Maternal Grandmother   . Anxiety disorder Brother     Past Surgical History:  Procedure Laterality Date  . DILATION AND CURETTAGE OF UTERUS    . LAPAROSCOPY ABDOMEN DIAGNOSTIC     Social History   Occupational History  . Not on file  Tobacco Use  . Smoking status: Never Smoker  . Smokeless tobacco: Never Used  Vaping Use  . Vaping Use: Never used  Substance and Sexual Activity  . Alcohol use: Yes    Comment: once every other month  . Drug use: Never  . Sexual activity: Not Currently    Birth control/protection: Pill

## 2020-04-14 ENCOUNTER — Telehealth: Payer: Self-pay | Admitting: Orthopedic Surgery

## 2020-04-14 NOTE — Telephone Encounter (Signed)
Forms received from [employer] [facility]. Sent to [facility]. 

## 2020-04-19 ENCOUNTER — Encounter: Payer: Self-pay | Admitting: Family

## 2020-04-21 ENCOUNTER — Telehealth: Payer: Self-pay | Admitting: Psychiatry

## 2020-04-21 NOTE — Telephone Encounter (Signed)
Pt left message that she has started to experience restlessness from the Abilify. Pt would like a call back to discuss her options.

## 2020-04-21 NOTE — Telephone Encounter (Signed)
Tried to reach patient for more information, left message to call back after 9 am to discuss side effects.

## 2020-04-21 NOTE — Telephone Encounter (Signed)
Returned call to pt. She reports that her mood has improved with Abilify and has had increased energy and motivation. She reports that she has had difficulty sitting still. She reports that she feels like she has to move around. Reports that she noticed restlessness about a week ago. She reports feeling "antsy."   Plan: Hold Abilify for 2-5 days until restlessness resolves, then resume 1/2 tablet daily. Discussed option of starting Propranolol prn to relieve restlessness. Pt declines. Encouraged pt to call office if restlessness worsens and she would like to start Propranolol prn.

## 2020-04-26 ENCOUNTER — Encounter: Payer: Self-pay | Admitting: Family

## 2020-04-26 ENCOUNTER — Ambulatory Visit (INDEPENDENT_AMBULATORY_CARE_PROVIDER_SITE_OTHER): Payer: No Typology Code available for payment source

## 2020-04-26 ENCOUNTER — Ambulatory Visit: Payer: No Typology Code available for payment source | Admitting: Family

## 2020-04-26 DIAGNOSIS — S92302A Fracture of unspecified metatarsal bone(s), left foot, initial encounter for closed fracture: Secondary | ICD-10-CM

## 2020-04-26 NOTE — Progress Notes (Signed)
Office Visit Note   Patient: Victoria Baxter           Date of Birth: March 01, 1971           MRN: [id number] Visit Date: 04/26/2020              Requested by: Girtha Rm, NP-C [place],  [place] [address] PCP: Girtha Rm, NP-C  No chief complaint on file.     HPI: Patient is a 49 year old woman who presents today in follow up for left 5th metatarsal shaft fracture. She has been non weight bearing with crutches and post op shoe.  She states that she was jumping up for a smoke detector that was alarming in the night. Was seen at [facility] urgent care and radiographs were performed.    Continues out of work  Assessment & Plan: Visit Diagnoses:  1. Closed fracture of shaft of metatarsal bone of left foot, initial encounter     Plan: Placed in a postop shoe advised to continue the post op shoe. Will advance to full weight bearing as she tolerates. She will follow-up in 3 weeks radiographs of the left foot  Follow-Up Instructions: Return in about 3 weeks (around 05/17/2020).   Ortho Exam  Patient is alert, oriented, no adenopathy, well-dressed, normal affect, normal respiratory effort. On examination of the left foot she has a good dorsalis pedis pulse.  Mild to moderate edema.  Tender to 5th MT.  Imaging: XR Foot 2 Views Left  Result Date: 04/26/2020 Radiographs of left foot show stable alignment of 5th metatarsal shaft fracture.   No images are attached to the encounter.  Labs: No results found for: HGBA1C, ESRSEDRATE, CRP, LABURIC, REPTSTATUS, GRAMSTAIN, CULT, LABORGA   Lab Results  Component Value Date   ALBUMIN 4.3 03/17/2020   ALBUMIN 4.6 03/08/2020   ALBUMIN 4.6 05/01/2018    No results found for: MG Lab Results  Component Value Date   VD25OH 38.2 03/08/2020    No results found for: PREALBUMIN CBC EXTENDED Latest Ref Rng & Units 03/17/2020 03/08/2020 05/01/2018  WBC 3.4 - 10.8 x10E3/uL 6.2 8.5 6.7  RBC 3.77 - 5.28 x10E6/uL 5.01  5.59(H) 5.17  HGB 11.1 - 15.9 g/dL 15.9 17.2(H) 16.2(H)  HCT 34.0 - 46.6 % 46.2 51.9(H) 46.5  PLT 150 - 450 x10E3/uL 334 394 329  NEUTROABS 1 - 7 x10E3/uL 3.3 4.9 3.4  LYMPHSABS 0 - 3 x10E3/uL 2.3 2.7 2.8     There is no height or weight on file to calculate BMI.  Orders:  Orders Placed This Encounter  Procedures  . XR Foot 2 Views Left   No orders of the defined types were placed in this encounter.    Procedures: No procedures performed  Clinical Data: No additional findings.  ROS:  All other systems negative, except as noted in the HPI. Review of Systems  Constitutional: Negative for chills and fever.  Musculoskeletal: Positive for arthralgias.    Objective: Vital Signs: There were no vitals taken for this visit.  Specialty Comments:  No specialty comments available.  PMFS History: Patient Active Problem List   Diagnosis Date Noted  . Depression 04/07/2018  . Fatigue 04/07/2018  . Chronic nonintractable headache 04/07/2018  . Endometriosis    Past Medical History:  Diagnosis Date  . Depression   . Depression   . Endometriosis   . Fibroid     Family History  Problem Relation Age of Onset  . Uterine cancer Mother 20  .  Other Mother        brain tumor 2001  . Basal cell carcinoma Father   . Prostate cancer Father   . Other Father        sarcomatoid carcinoma  . Breast cancer Maternal Aunt   . Cancer Paternal Aunt   . Diabetes Paternal Grandmother   . Depression Maternal Grandmother   . Anxiety disorder Brother     Past Surgical History:  Procedure Laterality Date  . DILATION AND CURETTAGE OF UTERUS    . LAPAROSCOPY ABDOMEN DIAGNOSTIC     Social History   Occupational History  . Not on file  Tobacco Use  . Smoking status: Never Smoker  . Smokeless tobacco: Never Used  Vaping Use  . Vaping Use: Never used  Substance and Sexual Activity  . Alcohol use: Yes    Comment: once every other month  . Drug use: Never  . Sexual activity: Not  Currently    Birth control/protection: Pill

## 2020-04-30 ENCOUNTER — Other Ambulatory Visit: Payer: Self-pay | Admitting: Psychiatry

## 2020-04-30 DIAGNOSIS — F332 Major depressive disorder, recurrent severe without psychotic features: Secondary | ICD-10-CM

## 2020-05-01 NOTE — Patient Instructions (Addendum)

## 2020-05-01 NOTE — Progress Notes (Signed)
Subjective:    Patient ID: Victoria Baxter, female    DOB: [birth year], 49 y.o.   MRN: [id number]  HPI Chief Complaint  Patient presents with   cpe    fasitng cpe, sees obgyn, sweats smells like pneumonia smell, denies covid vaccines   She is here for a complete physical exam. Last CPE: 2019   Other providers: OB/GYN - Dr. Talbert Baxter  Orthopedist- Victoria Prader, NP  Psychiatrist- Victoria Baxter  Dr. Reece Baxter- at [facility] (magnetic stimulation) for depression     Social history: Lives with her fur children, works at [employer]  Denies smoking, drinking alcohol, drug use  Diet: fairly healthy  Excerise: not lately   Immunizations: Tdap UTD. Would like to get the Covid vaccine today.   Health maintenance:  Mammogram: 2018 and has order in to call and schedule  Colonoscopy: 2013 in [place], [place]  Last Gynecological Exam: at [place] Exam: 6 months ago  Last Eye Exam: years ago   Wears seatbelt always, uses sunscreen, smoke detectors in home and functioning, does not text while driving and feels safe in home environment.   Reviewed allergies, medications, past medical, surgical, family, and social history.   Review of Systems Review of Systems Constitutional: -fever, -chills, -sweats, -unexpected weight change,-fatigue ENT: -runny nose, -ear pain, -sore throat Cardiology:  -chest pain, -palpitations, -edema Respiratory: -cough, -shortness of breath, -wheezing Gastroenterology: -abdominal pain, -nausea, -vomiting, -diarrhea, -constipation  Hematology: -bleeding or bruising problems Musculoskeletal: -arthralgias, -myalgias, -joint swelling, -back pain Ophthalmology: -vision changes Urology: -dysuria, -difficulty urinating, -hematuria, -urinary frequency, -urgency Neurology: -headache, -weakness, -tingling, -numbness       Objective:   Physical Exam BP 120/72    Pulse 72    Ht 5\' 10"  (1.778 m)    Wt 171 lb 12.8 oz (77.9 kg)    BMI 24.65 kg/m   General  Appearance:    Alert, cooperative, no distress, appears stated age  Head:    Normocephalic, without obvious abnormality, atraumatic  Eyes:    PERRL, conjunctiva/corneas clear, EOM's intact  Ears:    Normal TM's and external ear canals  Nose:   Mask on   Throat:   Lips, mucosa, and tongue normal; teeth and gums normal  Neck:   Supple, no lymphadenopathy;  thyroid:  no   enlargement/tenderness/nodules; no JVD  Back:    Spine nontender, no curvature, ROM normal, no CVA     tenderness  Lungs:     Clear to auscultation bilaterally without wheezes, rales or     ronchi; respirations unlabored  Chest Wall:    No tenderness or deformity   Heart:    Regular rate and rhythm, S1 and S2 normal, no murmur, rub   or gallop  Breast Exam:    OB/GYN   Abdomen:     Soft, non-tender, nondistended, normoactive bowel sounds,    no masses, no hepatosplenomegaly  Genitalia:    OB/GYN      Extremities:   No clubbing, cyanosis or edema  Pulses:   2+ and symmetric all extremities  Skin:   Skin color, texture, turgor normal, no rashes or lesions  Lymph nodes:   Cervical, supraclavicular, and axillary nodes normal  Neurologic:   CNII-XII intact, normal strength, sensation and gait; reflexes 2+ and symmetric throughout          Psych:   Normal mood, affect, hygiene and grooming.         Assessment & Plan:  Routine general medical examination at  a health care facility - Plan: Comprehensive metabolic panel, POCT Urinalysis DIP (Proadvantage Device), Lipid panel -She is here today for fasting CPE.  Preventive health care reviewed.  She sees an OB/GYN.  Overdue for mammogram and she will call and schedule this.  Referral to GI for her first screening colonoscopy.  Counseling on healthy lifestyle including diet and exercise.  Discussed safety and health promotion. Immunizations reviewed and she was hesitant initially about the Covid vaccine but after our conversation, she decided that she would like to get [employer]  vaccine today.  Encounter for screening mammogram for malignant neoplasm of breast -She is aware that she should call and schedule this and that she is overdue for her mammogram  Screen for colon cancer - Plan: Ambulatory referral to Gastroenterology -Per screening guidelines  Screening for HIV without presence of risk factors - Plan: HIV Antibody (routine testing w rflx) -Done per guidelines  Elevated LDL cholesterol level - Plan: Lipid panel -Recommend healthy diet and exercise.  Follow-up pending lipid panel results  Vaccine counseling -First Pfizer Covid vaccine given today.  She tolerated this well.  She was monitored for 15 minutes and no side effects upon departure.  Discussed potential side effects.  She will return when she is due for her second Pfizer vaccine

## 2020-05-02 ENCOUNTER — Encounter: Payer: Self-pay | Admitting: Family Medicine

## 2020-05-02 ENCOUNTER — Ambulatory Visit: Payer: No Typology Code available for payment source | Admitting: Family Medicine

## 2020-05-02 ENCOUNTER — Other Ambulatory Visit: Payer: Self-pay

## 2020-05-02 VITALS — BP 120/72 | HR 72 | Ht 70.0 in | Wt 171.8 lb

## 2020-05-02 DIAGNOSIS — Z0000 Encounter for general adult medical examination without abnormal findings: Secondary | ICD-10-CM

## 2020-05-02 DIAGNOSIS — Z1211 Encounter for screening for malignant neoplasm of colon: Secondary | ICD-10-CM

## 2020-05-02 DIAGNOSIS — Z7185 Encounter for immunization safety counseling: Secondary | ICD-10-CM

## 2020-05-02 DIAGNOSIS — E7800 Pure hypercholesterolemia, unspecified: Secondary | ICD-10-CM

## 2020-05-02 DIAGNOSIS — Z1231 Encounter for screening mammogram for malignant neoplasm of breast: Secondary | ICD-10-CM

## 2020-05-02 DIAGNOSIS — Z114 Encounter for screening for human immunodeficiency virus [HIV]: Secondary | ICD-10-CM

## 2020-05-02 LAB — POCT URINALYSIS DIP (PROADVANTAGE DEVICE)
Bilirubin, UA: NEGATIVE
Blood, UA: NEGATIVE
Glucose, UA: NEGATIVE mg/dL
Ketones, POC UA: NEGATIVE mg/dL
Leukocytes, UA: NEGATIVE
Nitrite, UA: NEGATIVE
Protein Ur, POC: NEGATIVE mg/dL
Specific Gravity, Urine: 1.03
Urobilinogen, Ur: NEGATIVE
pH, UA: 5 (ref 5.0–8.0)

## 2020-05-03 ENCOUNTER — Telehealth: Payer: Self-pay

## 2020-05-03 LAB — COMPREHENSIVE METABOLIC PANEL
ALT: 21 IU/L (ref 0–32)
AST: 18 IU/L (ref 0–40)
Albumin/Globulin Ratio: 1.6 (ref 1.2–2.2)
Albumin: 4.1 g/dL (ref 3.8–4.9)
Alkaline Phosphatase: 93 IU/L (ref 48–121)
BUN/Creatinine Ratio: 14 (ref 9–23)
BUN: 13 mg/dL (ref 6–24)
Bilirubin Total: 0.5 mg/dL (ref 0.0–1.2)
CO2: 22 mmol/L (ref 20–29)
Calcium: 9.4 mg/dL (ref 8.7–10.2)
Chloride: 104 mmol/L (ref 96–106)
Creatinine, Ser: 0.95 mg/dL (ref 0.57–1.00)
GFR calc Af Amer: 80 mL/min/{1.73_m2} (ref >59)
GFR calc non Af Amer: 70 mL/min/{1.73_m2} (ref >59)
Globulin, Total: 2.5 g/dL (ref 1.5–4.5)
Glucose: 94 mg/dL (ref 65–99)
Potassium: 5.1 mmol/L (ref 3.5–5.2)
Sodium: 140 mmol/L (ref 134–144)
Total Protein: 6.6 g/dL (ref 6.0–8.5)

## 2020-05-03 LAB — LIPID PANEL
Chol/HDL Ratio: 4.2 ratio (ref 0.0–4.4)
Cholesterol, Total: 191 mg/dL (ref 100–199)
HDL: 46 mg/dL (ref >39)
LDL Chol Calc (NIH): 112 mg/dL — ABNORMAL HIGH (ref 0–99)
Triglycerides: 187 mg/dL — ABNORMAL HIGH (ref 0–149)
VLDL Cholesterol Cal: 33 mg/dL (ref 5–40)

## 2020-05-03 LAB — HIV ANTIBODY (ROUTINE TESTING W REFLEX): HIV Screen 4th Generation wRfx: NONREACTIVE

## 2020-05-03 NOTE — Telephone Encounter (Signed)
Patient called  Patient wanted to speak with you about getting paperwork faxed over.  Call back:[phone]

## 2020-05-04 NOTE — Telephone Encounter (Signed)
Per Victoria Baxter she sent a message yesterday so this may be a duplicate but the pt is needing record sent to the [facility]. Erin included the fax number in the message she sent. If you could call pt and let her know please when this has been sent over. Thanks

## 2020-05-05 ENCOUNTER — Encounter: Payer: Self-pay | Admitting: Psychiatry

## 2020-05-05 ENCOUNTER — Other Ambulatory Visit: Payer: Self-pay

## 2020-05-05 ENCOUNTER — Ambulatory Visit (INDEPENDENT_AMBULATORY_CARE_PROVIDER_SITE_OTHER): Payer: No Typology Code available for payment source | Admitting: Psychiatry

## 2020-05-05 DIAGNOSIS — F331 Major depressive disorder, recurrent, moderate: Secondary | ICD-10-CM | POA: Diagnosis not present

## 2020-05-05 DIAGNOSIS — F411 Generalized anxiety disorder: Secondary | ICD-10-CM | POA: Diagnosis not present

## 2020-05-05 MED ORDER — ARIPIPRAZOLE 2 MG PO TABS: 2.0000 mg | ORAL_TABLET | Freq: Every day | ORAL | 1 refills | Status: DC

## 2020-05-05 NOTE — Progress Notes (Signed)
[name] [id number] [birth year] 49 y.o.  Subjective:   Patient ID:  Victoria Baxter is a 50 y.o. (DOB 13-Oct-1970) female.  Chief Complaint:  Chief Complaint  Patient presents with  . Depression  . Anxiety    HPI Victoria Baxter presents to the office today for follow-up of depression and anxiety. She reports that she has not re-started Abilify because she wanted to see if improvement is related to Abilify or [place]on. She noticed some negative, hopeless thoughts again when she stopped Abilify and continued [place]. She reports that she is able to re-direct negative thoughts. She reports that she is more motivated and willing to do things. Denies sad mood. Denies anxiety, however she has not been at work which is usually triggers her anxiety. Continues to doubt herself. She reports difficulty with concentration and that her mind will "go blank." Energy and motivation have improved. Sleeping well. She reports that she has been making healthier eating choices. Has been enjoying artwork again. Denies SI.   She is continuing to receive [place] treatment.   Has been out of work due to foot injury.   Past Psychiatric Medication Trials: Zoloft- Took 50 mg from 4/02-2/03 Pamelor- Took 10 mg 3/03-9/03 Celexa- Took 20 mg 9/03-10/03. Increased up to 40 mg qd Prozac- 20 mg 11/03 Lexapro- 20 mg 10/12-5/16. Initially effective and then no longer as effective.  Wellbutrin XL 150 mg- Has taken from June 2016-present. Notices increased mind racing when she does not take it.  Effexor XR- Took 75 mg June 2016. Increased by PCP to 150 mg po qd and this caused palpitations and insomnia. Has been somewhat helpful.  Pristiq Concerta- Worsening concentration and possible dissociation. Found herself driving in the middle of the road Abilify  May have taken Paxil   PHQ2-9     Office Visit from 05/02/2020 in [place] from 03/08/2020 in [place]  from 12/03/2018 in [place] Visit from 05/01/2018 in [place] Visit from 04/07/2018 in [place]  PHQ-2 Total Score 0 6 0 1 4  PHQ-9 Total Score -- 25 -- -- 25       Review of Systems:  Review of Systems  Musculoskeletal: Negative for gait problem.       Improved foot pain  Neurological: Negative for tremors.  Psychiatric/Behavioral:       Please refer to HPI    Medications: I have reviewed the patient's current medications.  Current Outpatient Medications  Medication Sig Dispense Refill  . buPROPion (WELLBUTRIN XL) 150 MG 24 hr tablet Take 1 tablet (150 mg total) by mouth daily. 90 tablet 1  . Cholecalciferol (VITAMIN D PO) Take by mouth daily.     . Multiple Vitamin (MULTIVITAMIN) tablet Take 1 tablet by mouth daily.    . NON FORMULARY 700 mg. [place]     . norethindrone-ethinyl estradiol-iron (JUNEL FE 1.5/30) 1.5-30 MG-MCG tablet TAKE 1 TABLET DAILY TAKE   CONTINUOUSLY FOR 2-3 MONTHSIN A ROW. 4 Package 3  . venlafaxine XR (EFFEXOR-XR) 37.5 MG 24 hr capsule Take 3 capsules (112.5 mg total) by mouth daily with breakfast. 270 capsule 0  . ARIPiprazole (ABILIFY) 2 MG tablet Take 1 tablet (2 mg total) by mouth daily. 30 tablet 1  . ARIPiprazole (ABILIFY) 5 MG tablet TAKE 1 TABLET BY MOUTH EVERY DAY (Patient not taking: Reported on 05/05/2020) 30 tablet 0   No current facility-administered medications for this visit.    Medication Side Effects:  Other: Akathisia with Abilify  Allergies: No Known Allergies  Past Medical History:  Diagnosis Date  . Depression   . Depression   . Endometriosis   . Fibroid     Family History  Problem Relation Age of Onset  . Uterine cancer Mother 58  . Other Mother        brain tumor 2001  . Basal cell carcinoma Father   . Prostate cancer Father   . Other Father        sarcomatoid carcinoma  . Breast cancer Maternal Aunt   . Cancer Paternal Aunt   . Diabetes Paternal Grandmother   .  Depression Maternal Grandmother   . Anxiety disorder Brother     Social History   Socioeconomic History  . Marital status: Single    Spouse name: Not on file  . Number of children: Not on file  . Years of education: Not on file  . Highest education level: Not on file  Occupational History  . Not on file  Tobacco Use  . Smoking status: Never Smoker  . Smokeless tobacco: Never Used  Vaping Use  . Vaping Use: Never used  Substance and Sexual Activity  . Alcohol use: Yes    Comment: once every other month  . Drug use: Never  . Sexual activity: Not Currently    Birth control/protection: Pill  Other Topics Concern  . Not on file  Social History Narrative  . Not on file   Social Determinants of Health   Financial Resource Strain:   . Difficulty of Paying Living Expenses: Not on file  Food Insecurity:   . Worried About Charity fundraiser in the Last Year: Not on file  . Ran Out of Food in the Last Year: Not on file  Transportation Needs:   . Lack of Transportation (Medical): Not on file  . Lack of Transportation (Non-Medical): Not on file  Physical Activity:   . Days of Exercise per Week: Not on file  . Minutes of Exercise per Session: Not on file  Stress:   . Feeling of Stress : Not on file  Social Connections:   . Frequency of Communication with Friends and Family: Not on file  . Frequency of Social Gatherings with Friends and Family: Not on file  . Attends Religious Services: Not on file  . Active Member of Clubs or Organizations: Not on file  . Attends Archivist Meetings: Not on file  . Marital Status: Not on file  Intimate Partner Violence:   . Fear of Current or Ex-Partner: Not on file  . Emotionally Abused: Not on file  . Physically Abused: Not on file  . Sexually Abused: Not on file    Past Medical History, Surgical history, Social history, and Family history were reviewed and updated as appropriate.   Please see review of systems for further  details on the patient's review from today.   Objective:   Physical Exam:  There were no vitals taken for this visit.  Physical Exam Constitutional:      General: She is not in acute distress. Musculoskeletal:        General: No deformity.  Neurological:     Mental Status: She is alert and oriented to person, place, and time.     Coordination: Coordination normal.  Psychiatric:        Attention and Perception: Attention and perception normal. She does not perceive auditory or visual hallucinations.  Mood and Affect: Mood normal. Mood is not anxious or depressed. Affect is not labile, blunt, angry or inappropriate.        Speech: Speech normal.        Behavior: Behavior normal.        Thought Content: Thought content normal. Thought content is not paranoid or delusional. Thought content does not include homicidal or suicidal ideation. Thought content does not include homicidal or suicidal plan.        Cognition and Memory: Cognition and memory normal.        Judgment: Judgment normal.     Comments: Insight intact     Lab Review:     Component Value Date/Time   NA 140 05/02/2020 0906   K 5.1 05/02/2020 0906   CL 104 05/02/2020 0906   CO2 22 05/02/2020 0906   GLUCOSE 94 05/02/2020 0906   BUN 13 05/02/2020 0906   CREATININE 0.95 05/02/2020 0906   CALCIUM 9.4 05/02/2020 0906   PROT 6.6 05/02/2020 0906   ALBUMIN 4.1 05/02/2020 0906   AST 18 05/02/2020 0906   ALT 21 05/02/2020 0906   ALKPHOS 93 05/02/2020 0906   BILITOT 0.5 05/02/2020 0906   GFRNONAA 70 05/02/2020 0906   GFRAA 80 05/02/2020 0906       Component Value Date/Time   WBC 6.2 03/17/2020 1337   RBC 5.01 03/17/2020 1337   HGB 15.9 03/17/2020 1337   HCT 46.2 03/17/2020 1337   PLT 334 03/17/2020 1337   MCV 92 03/17/2020 1337   MCH 31.7 03/17/2020 1337   MCHC 34.4 03/17/2020 1337   RDW 12.0 03/17/2020 1337   LYMPHSABS 2.3 03/17/2020 1337   EOSABS 0.2 03/17/2020 1337   BASOSABS 0.1 03/17/2020 1337     No results found for: POCLITH, LITHIUM   No results found for: PHENYTOIN, PHENOBARB, VALPROATE, CBMZ   .res Assessment: Plan:   Discussed re-starting Abilify to prevent recurrence of depressive s/s since pt noticed some slight recurrence of negative, hopeless thoughts after stopping Abilify 5 mg po qd. Discussed that akathisia is typically dose related and therefore may not occur at 2 mg dose. Recommend re-starting Abilify 2 mg to determine if this dose may be effective for depression without causing akathisia. Discussed that Abilify has a long half-life and can be taken every other day and 2 mg q other day may also be an option if akathisia occurs with Abilify 2 mg qd. Pt advised to contact office if akathisia occurs.  Continue Wellbutrin XL 150 mg daily for depression. Continue Effexor XR 112.5 mg daily for depression and anxiety. Discussed potential benefits of therapy and patient agrees with plan to resume therapy.  Referral made to start therapy with Lanetta Inch, [facility]. Recommend continuing [place] as directed by [place] providers. Patient to follow-up with this provider in 4 weeks or sooner if clinically indicated. Patient advised to contact office with any questions, adverse effects, or acute worsening in signs and symptoms.  Victoria Baxter was seen today for depression and anxiety.  Diagnoses and all orders for this visit:  Generalized anxiety disorder -     ARIPiprazole (ABILIFY) 2 MG tablet; Take 1 tablet (2 mg total) by mouth daily.  Moderate recurrent major depression (HCC) -     ARIPiprazole (ABILIFY) 2 MG tablet; Take 1 tablet (2 mg total) by mouth daily.     Please see After Visit Summary for patient specific instructions.  Future Appointments  Date Time Provider [place]  05/17/2020 10:45 AM Dondra Prader  R, NP [facility] None  05/23/2020 11:00 AM [facility]  06/02/2020  8:30 AM Thayer Headings, PMHNP [facility] None  06/07/2020  9:00 AM [place],  [facility] None  09/14/2020  8:00 AM Salvadore Dom, MD [place] None    No orders of the defined types were placed in this encounter.   -------------------------------

## 2020-05-06 ENCOUNTER — Encounter: Payer: Self-pay | Admitting: Gastroenterology

## 2020-05-06 ENCOUNTER — Other Ambulatory Visit: Payer: Self-pay | Admitting: Family Medicine

## 2020-05-06 DIAGNOSIS — Z1231 Encounter for screening mammogram for malignant neoplasm of breast: Secondary | ICD-10-CM

## 2020-05-17 ENCOUNTER — Ambulatory Visit (INDEPENDENT_AMBULATORY_CARE_PROVIDER_SITE_OTHER): Payer: No Typology Code available for payment source

## 2020-05-17 ENCOUNTER — Encounter: Payer: Self-pay | Admitting: Family

## 2020-05-17 ENCOUNTER — Other Ambulatory Visit: Payer: Self-pay

## 2020-05-17 ENCOUNTER — Ambulatory Visit
Admission: RE | Admit: 2020-05-17 | Discharge: 2020-05-17 | Disposition: A | Discharge status: Home / Self Care | Payer: No Typology Code available for payment source | Source: Ambulatory Visit | Attending: Family Medicine | Admitting: Family Medicine

## 2020-05-17 ENCOUNTER — Ambulatory Visit: Payer: No Typology Code available for payment source | Admitting: Family

## 2020-05-17 VITALS — Ht 70.0 in | Wt 171.0 lb

## 2020-05-17 DIAGNOSIS — S92302A Fracture of unspecified metatarsal bone(s), left foot, initial encounter for closed fracture: Secondary | ICD-10-CM | POA: Diagnosis not present

## 2020-05-17 DIAGNOSIS — Z1231 Encounter for screening mammogram for malignant neoplasm of breast: Secondary | ICD-10-CM

## 2020-05-17 NOTE — Progress Notes (Signed)
Office Visit Note   Patient: Victoria Baxter           Date of Birth: [birth year]           MRN: [id number] Visit Date: 05/17/2020              Requested by: Girtha Rm, NP-C [place],  [place] [address] PCP: Girtha Rm, NP-C  Chief Complaint  Patient presents with   Left Foot - Follow-up    5th MT shaft fx       HPI: The patient is a 49 year old woman who presents today 5 weeks out from a left fifth metatarsal shaft fracture.  She has been full weightbearing in a postop shoe.  She reports that she continues to have some aching and swelling she has been on her foot a significant amount during the day overall weightbearing in the postop shoe has been pain-free.  If possible she desires to return to work for seated/light duty  Assessment & Plan: Visit Diagnoses:  1. Closed fracture of shaft of metatarsal bone of left foot, initial encounter     Plan: She will continue in the postop shoe for 1 more week.  At that point discussed that she could transition to a stiff soled walking shoe that is inflexible should this be pain-free for her.  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  Follow-Up Instructions: Return in about 3 weeks (around 06/07/2020).   Ortho Exam  Patient is alert, oriented, no adenopathy, well-dressed, normal affect, normal respiratory effort. On examination of the left foot she has reduced edema.  Mild tenderness along her fifth metatarsal  Imaging: XR Foot 2 Views Left  Result Date: 05/17/2020 Radiographs of the left foot show the fifth metatarsal shaft fracture with interval callus formation.  There is been no further displacement in the interim.  No images are attached to the encounter.  Labs: No results found for: HGBA1C, ESRSEDRATE, CRP,  LABURIC, REPTSTATUS, GRAMSTAIN, CULT, LABORGA   Lab Results  Component Value Date   ALBUMIN 4.1 05/02/2020   ALBUMIN 4.3 03/17/2020   ALBUMIN 4.6 03/08/2020    No results found for: MG Lab Results  Component Value Date   VD25OH 38.2 03/08/2020    No results found for: PREALBUMIN CBC EXTENDED Latest Ref Rng & Units 03/17/2020 03/08/2020 05/01/2018  WBC 3.4 - 10.8 x10E3/uL 6.2 8.5 6.7  RBC 3.77 - 5.28 x10E6/uL 5.01 5.59(H) 5.17  HGB 11.1 - 15.9 g/dL 15.9 17.2(H) 16.2(H)  HCT 34.0 - 46.6 % 46.2 51.9(H) 46.5  PLT 150 - 450 x10E3/uL 334 394 329  NEUTROABS 1 - 7 x10E3/uL 3.3 4.9 3.4  LYMPHSABS 0 - 3 x10E3/uL 2.3 2.7 2.8     Body mass index is 24.54 kg/m.  Orders:  Orders Placed This Encounter  Procedures   XR Foot 2 Views Left   No orders of the defined types were placed in this encounter.    Procedures: No procedures performed  Clinical Data: No additional findings.  ROS:  All other systems negative, except as noted in the HPI. Review of Systems  Objective: Vital Signs: Ht 5\' 10"  (1.778 m)    Wt 171 lb (77.6 kg)    BMI 24.54 kg/m   Specialty Comments:  No specialty comments available.  PMFS History: Patient Active Problem List   Diagnosis Date Noted   Depression 04/07/2018   Fatigue 04/07/2018   Chronic nonintractable headache 04/07/2018   Endometriosis    Past Medical History:  Diagnosis Date   Depression    Depression    Endometriosis    Fibroid     Family History  Problem Relation Age of Onset   Uterine cancer Mother 29   Other Mother        brain tumor 2001   Basal cell carcinoma Father    Prostate cancer Father    Other Father        sarcomatoid carcinoma   Breast cancer Maternal Aunt    Cancer Paternal Aunt    Diabetes Paternal Grandmother    Depression Maternal Grandmother    Anxiety disorder Brother     Past Surgical History:  Procedure Laterality Date   DILATION AND CURETTAGE OF UTERUS     LAPAROSCOPY  ABDOMEN DIAGNOSTIC     Social History   Occupational History   Not on file  Tobacco Use   Smoking status: Never Smoker   Smokeless tobacco: Never Used  Vaping Use   Vaping Use: Never used  Substance and Sexual Activity   Alcohol use: Yes    Comment: once every other month   Drug use: Never   Sexual activity: Not Currently    Birth control/protection: Pill

## 2020-05-23 ENCOUNTER — Other Ambulatory Visit: Payer: Self-pay

## 2020-05-23 ENCOUNTER — Ambulatory Visit (INDEPENDENT_AMBULATORY_CARE_PROVIDER_SITE_OTHER): Payer: No Typology Code available for payment source

## 2020-05-23 DIAGNOSIS — Z23 Encounter for immunization: Secondary | ICD-10-CM | POA: Diagnosis not present

## 2020-06-02 ENCOUNTER — Other Ambulatory Visit: Payer: Self-pay

## 2020-06-02 ENCOUNTER — Ambulatory Visit (INDEPENDENT_AMBULATORY_CARE_PROVIDER_SITE_OTHER): Payer: No Typology Code available for payment source | Admitting: Psychiatry

## 2020-06-02 ENCOUNTER — Encounter: Payer: Self-pay | Admitting: Psychiatry

## 2020-06-02 DIAGNOSIS — F330 Major depressive disorder, recurrent, mild: Secondary | ICD-10-CM

## 2020-06-02 MED ORDER — VENLAFAXINE HCL ER 37.5 MG PO CP24: 112.5000 mg | ORAL_CAPSULE | Freq: Every day | ORAL | 0 refills | Status: DC

## 2020-06-02 MED ORDER — BUPROPION HCL ER (XL) 300 MG PO TB24: 300.0000 mg | ORAL_TABLET | Freq: Every day | ORAL | 1 refills | Status: DC

## 2020-06-02 NOTE — Progress Notes (Addendum)
Victoria Baxter [id number] 1971/08/05 49 y.o.  Subjective:   Patient ID:  Victoria Baxter is a 49 y.o. (DOB 1970-11-07) female.  Chief Complaint:  Chief Complaint  Patient presents with  . Other    Impaired concentration, low energy  . Follow-up    Depression    HPI Victoria Baxter presents to the office today for follow-up of depression and anxiety. She reports that she returned to work a little over a week ago. She reports that she is occasionally feeling down about herself and "now there is a fight in me" and is redirecting these thoughts. She reports some chronic mild irritability. She reports having difficulty being decisive and that concentration remains poor. She describes difficulty thinking through different situations and problem solving. She has difficulty reading books and will not be able to read more than 2-3 pages. She reports that she is having difficulty focusing on Bible study. Denies concentration issues in the past. She reports difficulty remembering things. She reports that concentration issues cause her frustration. She reports that she continues to feel tired and has somewhat disrupted sleep. She reports that she will sleep and then feel exhausted upon awakening. She tried Melatonin and experienced excessive grogginess. She reports that she stopped Abilify about 10 days ago and felt "tense" and causing sleep disturbance. Energy has been ok and worked 12 hours yesterday and did not feel exhausted at the end of her shift. She reports that her motivation has been improving. Denies anxiety or worry. Appetite has been stable. Has been trying to make healthier food choices. She reports that she is keeping up with housework and chores. Has been enjoying some art work and gardening. Has been socializing more and has been initiating social interaction. Denies SI.   Had last [place] treatment yesterday. She has been accepted to a program for psychology.   Past Psychiatric  Medication Trials: Zoloft- Took 50 mg from 4/02-2/03 Pamelor- Took 10 mg 3/03-9/03 Celexa- Took 20 mg 9/03-10/03. Increased up to 40 mg qd Prozac- 20 mg 11/03 Lexapro- 20 mg 10/12-5/16. Initially effective and then no longer as effective.  Wellbutrin XL 150 mg- Has taken from June 2016-present. Notices increased mind racing when she does not take it.  Effexor XR- Took 75 mg June 2016. Increased by PCP to 150 mg po qd and this caused palpitations and insomnia. Has been somewhat helpful.  Pristiq Concerta- Worsening concentration and possible dissociation. Found herself driving in the middle of the road Abilify- Helpful for depression. Caused akathisia/tension.   May have taken Paxil  PHQ2-9     Office Visit from 05/02/2020 in [facility] from 03/08/2020 in [facility] from 12/03/2018 in [facility] Visit from 05/01/2018 in [facility] Visit from 04/07/2018 in [facility]  PHQ-2 Total Score 0 6 0 1 4  PHQ-9 Total Score -- 25 -- -- 25       Review of Systems:  Review of Systems  Gastrointestinal: Negative.   Musculoskeletal: Negative for gait problem.  Neurological: Negative for tremors and headaches.  Psychiatric/Behavioral:       Please refer to HPI    Medications: I have reviewed the patient's current medications.  Current Outpatient Medications  Medication Sig Dispense Refill  . buPROPion (WELLBUTRIN XL) 300 MG 24 hr tablet Take 1 tablet (300 mg total) by mouth daily. 30 tablet 1  . Cholecalciferol (VITAMIN D PO) Take by mouth daily.     . Multiple Vitamin (  MULTIVITAMIN) tablet Take 1 tablet by mouth daily.    . NON FORMULARY 700 mg. Guduchi     . norethindrone-ethinyl estradiol-iron (JUNEL FE 1.5/30) 1.5-30 MG-MCG tablet TAKE 1 TABLET DAILY TAKE   CONTINUOUSLY FOR 2-3 MONTHSIN A ROW. 4 Package 3  . venlafaxine XR (EFFEXOR-XR) 37.5 MG 24 hr capsule Take 3 capsules (112.5  mg total) by mouth daily with breakfast. 270 capsule 0   No current facility-administered medications for this visit.    Medication Side Effects: Other: Akathisia with Abilify, possible concentration difficulties  Allergies: No Known Allergies  Past Medical History:  Diagnosis Date  . Depression   . Depression   . Endometriosis   . Fibroid     Family History  Problem Relation Age of Onset  . Uterine cancer Mother 63  . Other Mother        brain tumor 2001  . Basal cell carcinoma Father   . Prostate cancer Father   . Other Father        sarcomatoid carcinoma  . Breast cancer Maternal Aunt   . Cancer Paternal Aunt   . Diabetes Paternal Grandmother   . Depression Maternal Grandmother   . Anxiety disorder Brother     Social History   Socioeconomic History  . Marital status: Single    Spouse name: Not on file  . Number of children: Not on file  . Years of education: Not on file  . Highest education level: Not on file  Occupational History  . Not on file  Tobacco Use  . Smoking status: Never Smoker  . Smokeless tobacco: Never Used  Vaping Use  . Vaping Use: Never used  Substance and Sexual Activity  . Alcohol use: Yes    Comment: once every other month  . Drug use: Never  . Sexual activity: Not Currently    Birth control/protection: Pill  Other Topics Concern  . Not on file  Social History Narrative  . Not on file   Social Determinants of Health   Financial Resource Strain:   . Difficulty of Paying Living Expenses: Not on file  Food Insecurity:   . Worried About Charity fundraiser in the Last Year: Not on file  . Ran Out of Food in the Last Year: Not on file  Transportation Needs:   . Lack of Transportation (Medical): Not on file  . Lack of Transportation (Non-Medical): Not on file  Physical Activity:   . Days of Exercise per Week: Not on file  . Minutes of Exercise per Session: Not on file  Stress:   . Feeling of Stress : Not on file  Social  Connections:   . Frequency of Communication with Friends and Family: Not on file  . Frequency of Social Gatherings with Friends and Family: Not on file  . Attends Religious Services: Not on file  . Active Member of Clubs or Organizations: Not on file  . Attends Archivist Meetings: Not on file  . Marital Status: Not on file  Intimate Partner Violence:   . Fear of Current or Ex-Partner: Not on file  . Emotionally Abused: Not on file  . Physically Abused: Not on file  . Sexually Abused: Not on file    Past Medical History, Surgical history, Social history, and Family history were reviewed and updated as appropriate.   Please see review of systems for further details on the patient's review from today.   Objective:   Physical Exam:  There were no  vitals taken for this visit.  Physical Exam Constitutional:      General: She is not in acute distress. Musculoskeletal:        General: No deformity.  Neurological:     Mental Status: She is alert and oriented to person, place, and time.     Coordination: Coordination normal.  Psychiatric:        Attention and Perception: Attention and perception normal. She does not perceive auditory or visual hallucinations.        Mood and Affect: Mood normal. Mood is not anxious or depressed. Affect is not labile, blunt, angry or inappropriate.        Speech: Speech normal.        Behavior: Behavior normal.        Thought Content: Thought content normal. Thought content is not paranoid or delusional. Thought content does not include homicidal or suicidal ideation. Thought content does not include homicidal or suicidal plan.        Cognition and Memory: Cognition and memory normal.        Judgment: Judgment normal.     Comments: Insight intact Reports minimal depressed mood and mild irritability.      Lab Review:     Component Value Date/Time   NA 140 05/02/2020 0906   K 5.1 05/02/2020 0906   CL 104 05/02/2020 0906   CO2 22  05/02/2020 0906   GLUCOSE 94 05/02/2020 0906   BUN 13 05/02/2020 0906   CREATININE 0.95 05/02/2020 0906   CALCIUM 9.4 05/02/2020 0906   PROT 6.6 05/02/2020 0906   ALBUMIN 4.1 05/02/2020 0906   AST 18 05/02/2020 0906   ALT 21 05/02/2020 0906   ALKPHOS 93 05/02/2020 0906   BILITOT 0.5 05/02/2020 0906   GFRNONAA 70 05/02/2020 0906   GFRAA 80 05/02/2020 0906       Component Value Date/Time   WBC 6.2 03/17/2020 1337   RBC 5.01 03/17/2020 1337   HGB 15.9 03/17/2020 1337   HCT 46.2 03/17/2020 1337   PLT 334 03/17/2020 1337   MCV 92 03/17/2020 1337   MCH 31.7 03/17/2020 1337   MCHC 34.4 03/17/2020 1337   RDW 12.0 03/17/2020 1337   LYMPHSABS 2.3 03/17/2020 1337   EOSABS 0.2 03/17/2020 1337   BASOSABS 0.1 03/17/2020 1337    No results found for: POCLITH, LITHIUM   No results found for: PHENYTOIN, PHENOBARB, VALPROATE, CBMZ   .res Assessment: Plan:   Discussed several possible treatment options to improve energy, motivation, and concentration.  Discussed potential benefits, risks, and side effects of increasing Wellbutrin XL to 300 mg daily.  Also discussed potential benefits, risks, and side effects of Rexulti for augmentation of depression since it has a similar mechanism of action to Abilify, which was helpful for her depressive signs and symptoms, and may have less risk of akathisia.  Patient reports that she would prefer to start with increase in Wellbutrin XL to 300 mg daily and then consider augmentation with Rexulti if her mood does not improve with increase in Wellbutrin XL or she is not able to tolerate 300 mg dose. Continue Effexor XR 112.5 mg daily for depression. Agree with plan to start psychotherapy with Lanetta Inch, [facility]. Patient to follow-up with this provider in 4 weeks or sooner if clinically indicated. Patient advised to contact office with any questions, adverse effects, or acute worsening in signs and symptoms.  Shawn was seen today for other and  follow-up.  Diagnoses and all orders for this  visit:  Mild episode of recurrent major depressive disorder (HCC) -     buPROPion (WELLBUTRIN XL) 300 MG 24 hr tablet; Take 1 tablet (300 mg total) by mouth daily. -     venlafaxine XR (EFFEXOR-XR) 37.5 MG 24 hr capsule; Take 3 capsules (112.5 mg total) by mouth daily with breakfast.     Please see After Visit Summary for patient specific instructions.  Future Appointments  Date Time Provider [place]  06/07/2020  9:00 AM Anson [place], [facility] None  06/08/2020  8:45 AM Suzan Slick, NP [facility] None  07/04/2020 10:30 AM Thayer Headings, PMHNP [facility] None  07/06/2020  8:00 AM [facility] PREVISIT RM [facility]  07/20/2020  8:00 AM [place], MD [facility]  09/14/2020  8:00 AM Salvadore Dom, MD [place] None    No orders of the defined types were placed in this encounter.   -------------------------------

## 2020-06-07 ENCOUNTER — Ambulatory Visit: Payer: No Typology Code available for payment source | Admitting: Family

## 2020-06-07 ENCOUNTER — Ambulatory Visit (INDEPENDENT_AMBULATORY_CARE_PROVIDER_SITE_OTHER): Payer: No Typology Code available for payment source | Admitting: Mental Health

## 2020-06-07 ENCOUNTER — Other Ambulatory Visit: Payer: Self-pay

## 2020-06-07 DIAGNOSIS — F330 Major depressive disorder, recurrent, mild: Secondary | ICD-10-CM | POA: Diagnosis not present

## 2020-06-07 DIAGNOSIS — F411 Generalized anxiety disorder: Secondary | ICD-10-CM | POA: Diagnosis not present

## 2020-06-07 NOTE — Progress Notes (Signed)
[facility] Counselor Initial Adult Exam  Name: Victoria Baxter Date: 06/07/2020 MRN: [id number] DOB: March 22, 1971 PCP: Girtha Rm, NP-C  Time spent:  67 Minutes   Reason for Visit /Presenting Problem: she states "I over think things". She moved to this area recently, about 2 years ago from [place]. In 2002, dx'd as in a psychiatric intensive outpatient due anxiety and depression. Copes w/ recurrent depressive episodes for many years. She copes w/ feeling tense, irritability, mind going blank and problems w/ concentration. Feels she is highly sensitive, gets migraines easily. Often prepares for the "worst case scenario", considers self a perfectionist. Oldest of 5 children, held to high standards by her mother; tried to please her so she would not get upset. Also states she is a people pleaser. Considers herself the "blacksheep", she is sensitive, likes hugs; rest of her family are the opposite. She stated the family was often sarcastic in communication. She is the oldest of the 5 children, felt targeted in the family due to her sensitivity.  She wrote a letter to her mother, expressing her hurt feelings; she stated her mother is a lot like her maternal grandfather.  She struggles at times w/ customers being entitled, she works at a [employer]; she is considering a job change.  Coping w/ her depression for years, most severe episode was in 2019, some SI at that time, not presently.   She wants to stop over thinking things, improve her ability to concentrate, improve motivation (goes home after work / weekends and does not accomplish much, watches tv etc.). improve decision making about her career decisions. She has gotten upset at her current job, "I stand up for myself"; she has been spoken to by mgmt due to how she reacts in those situations. This has held her back,  She is trying to figure out her purpose. Is considering going back to school for psychology; getting a different job would  allow her to go to school.   Mental Status Exam:   Appearance:   Casual     Behavior:  Appropriate  Motor:  Normal  Speech/Language:   Clear and Coherent  Affect:  full range  Mood:  depressed  Thought process:  normal  Thought content:    WNL  Sensory/Perceptual disturbances:    WNL  Orientation:  x4   Attention:  Good  Concentration:  Good  Memory:  intact  Fund of knowledge:   Good  Insight:    Good  Judgment:   Good  Impulse Control:  Good   Reported Symptoms:  Depressive episodes, anxiety  Risk Assessment: Danger to Self:  denied Self-injurious Behavior: No Danger to Others: No Duty to Warn:no Physical Aggression / Violence:No  Access to Firearms a concern: No  Gang Involvement:No  Patient / guardian was educated about steps to take if suicide or homicide risk level increases between visits: yes While future psychiatric events cannot be accurately predicted, the patient does not currently require acute inpatient psychiatric care and does not currently meet [facility] involuntary commitment criteria.  Substance Abuse History: Current substance abuse: No     Past Psychiatric History:   Outpatient Providers:  [facility] History of Psych Hospitalization: No  Psychological Testing: none  Family History:  Family History  Problem Relation Age of Onset  . Uterine cancer Mother 5  . Other Mother        brain tumor 2001  . Basal cell carcinoma Father   . Prostate cancer Father   . Other  Father        sarcomatoid carcinoma  . Breast cancer Maternal Aunt   . Cancer Paternal Aunt   . Diabetes Paternal Grandmother   . Depression Maternal Grandmother   . Anxiety disorder Brother     Medical History/Surgical History:  Past Medical History:  Diagnosis Date  . Depression   . Depression   . Endometriosis   . Fibroid     Past Surgical History:  Procedure Laterality Date  . DILATION AND CURETTAGE OF UTERUS    . LAPAROSCOPY ABDOMEN DIAGNOSTIC       Medications: Current Outpatient Medications  Medication Sig Dispense Refill  . buPROPion (WELLBUTRIN XL) 300 MG 24 hr tablet Take 1 tablet (300 mg total) by mouth daily. 30 tablet 1  . Cholecalciferol (VITAMIN D PO) Take by mouth daily.     . Multiple Vitamin (MULTIVITAMIN) tablet Take 1 tablet by mouth daily.    . NON FORMULARY 700 mg. Guduchi     . norethindrone-ethinyl estradiol-iron (JUNEL FE 1.5/30) 1.5-30 MG-MCG tablet TAKE 1 TABLET DAILY TAKE   CONTINUOUSLY FOR 2-3 MONTHSIN A ROW. 4 Package 3  . venlafaxine XR (EFFEXOR-XR) 37.5 MG 24 hr capsule Take 3 capsules (112.5 mg total) by mouth daily with breakfast. 270 capsule 0   No current facility-administered medications for this visit.    No Known Allergies  Diagnoses:    ICD-10-CM   1. Mild episode of recurrent major depressive disorder ([place])  F33.0   2. Generalized anxiety disorder  F41.1     Plan of Care: TBD   Anson [place], [facility]

## 2020-06-08 ENCOUNTER — Ambulatory Visit: Payer: No Typology Code available for payment source | Admitting: Family

## 2020-06-08 ENCOUNTER — Encounter: Payer: Self-pay | Admitting: Family

## 2020-06-08 VITALS — Ht 70.0 in | Wt 171.0 lb

## 2020-06-08 DIAGNOSIS — S92302D Fracture of unspecified metatarsal bone(s), left foot, subsequent encounter for fracture with routine healing: Secondary | ICD-10-CM

## 2020-06-08 NOTE — Progress Notes (Signed)
Office Visit Note   Patient: Victoria Baxter           Date of Birth: 1970/11/13           MRN: [id number] Visit Date: 06/08/2020              Requested by: Girtha Rm, NP-C [place],  [place] [address] PCP: Girtha Rm, NP-C  No chief complaint on file.     HPI: The patient is a 49 year old woman who is seen today in follow-up she is 8 weeks out from a left foot fifth metatarsal fracture.  Today she is in regular shoewear overall not having any concerns of pain.  She does have pain if she does prolonged weightbearing.  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  Assessment & Plan: Visit Diagnoses:  1. Closed fracture of shaft of metatarsal bone of left foot with routine healing, subsequent encounter     Plan: She will continue with her seated light duty work for 3 more weeks.  Anticipate she will be able to return to full duty work on October 29.  She will follow-up in the office as needed.  Follow-Up Instructions: Return if symptoms worsen or fail to improve.   Ortho Exam  Patient is alert, oriented, no adenopathy, well-dressed, normal affect, normal respiratory effort. On examination of the left foot there is no swelling no ecchymosis.  There is no tenderness to palpation of the fifth metatarsal  Imaging: No results found. No images are attached to the encounter.  Labs: No results found for: HGBA1C, ESRSEDRATE, CRP, LABURIC, REPTSTATUS, GRAMSTAIN, CULT, LABORGA   Lab Results  Component Value Date   ALBUMIN 4.1 05/02/2020   ALBUMIN 4.3 03/17/2020   ALBUMIN 4.6 03/08/2020    No results found for: MG Lab Results  Component Value Date   VD25OH 38.2 03/08/2020    No results found for: PREALBUMIN CBC EXTENDED Latest Ref Rng & Units 03/17/2020 03/08/2020 05/01/2018  WBC 3.4 - 10.8 x10E3/uL 6.2 8.5 6.7  RBC 3.77 - 5.28 x10E6/uL 5.01 5.59(H) 5.17  HGB 11.1 - 15.9 g/dL 15.9 17.2(H) 16.2(H)  HCT  34.0 - 46.6 % 46.2 51.9(H) 46.5  PLT 150 - 450 x10E3/uL 334 394 329  NEUTROABS 1 - 7 x10E3/uL 3.3 4.9 3.4  LYMPHSABS 0 - 3 x10E3/uL 2.3 2.7 2.8     There is no height or weight on file to calculate BMI.  Orders:  No orders of the defined types were placed in this encounter.  No orders of the defined types were placed in this encounter.    Procedures: No procedures performed  Clinical Data: No additional findings.  ROS:  All other systems negative, except as noted in the HPI. Review of Systems  Objective: Vital Signs: There were no vitals taken for this visit.  Specialty Comments:  No specialty comments available.  PMFS History: Patient Active Problem List   Diagnosis Date Noted  . Depression 04/07/2018  . Fatigue 04/07/2018  . Chronic nonintractable headache 04/07/2018  . Endometriosis    Past Medical History:  Diagnosis Date  . Depression   . Depression   . Endometriosis   . Fibroid     Family History  Problem Relation Age of Onset  . Uterine cancer Mother 77  . Other Mother        brain tumor 2001  . Basal cell carcinoma Father   . Prostate cancer Father   .  Other Father        sarcomatoid carcinoma  . Breast cancer Maternal Aunt   . Cancer Paternal Aunt   . Diabetes Paternal Grandmother   . Depression Maternal Grandmother   . Anxiety disorder Brother     Past Surgical History:  Procedure Laterality Date  . DILATION AND CURETTAGE OF UTERUS    . LAPAROSCOPY ABDOMEN DIAGNOSTIC     Social History   Occupational History  . Not on file  Tobacco Use  . Smoking status: Never Smoker  . Smokeless tobacco: Never Used  Vaping Use  . Vaping Use: Never used  Substance and Sexual Activity  . Alcohol use: Yes    Comment: once every other month  . Drug use: Never  . Sexual activity: Not Currently    Birth control/protection: Pill

## 2020-06-27 ENCOUNTER — Ambulatory Visit (INDEPENDENT_AMBULATORY_CARE_PROVIDER_SITE_OTHER): Payer: No Typology Code available for payment source | Admitting: Mental Health

## 2020-06-27 ENCOUNTER — Other Ambulatory Visit: Payer: Self-pay

## 2020-06-27 DIAGNOSIS — F330 Major depressive disorder, recurrent, mild: Secondary | ICD-10-CM

## 2020-06-27 DIAGNOSIS — F411 Generalized anxiety disorder: Secondary | ICD-10-CM

## 2020-06-27 NOTE — Progress Notes (Signed)
[facility] Counselor Initial Adult Exam  Name: Victoria Baxter Date: 06/27/2020 MRN: [id number] DOB: [birth year] PCP: Girtha Rm, NP-C  Time spent:  15 Minutes   Treatment: individual therapy  Mental Status Exam:   Appearance:   Casual     Behavior:  Appropriate  Motor:  Normal  Speech/Language:   Clear and Coherent  Affect:  full range  Mood:  depressed  Thought process:  normal  Thought content:    WNL  Sensory/Perceptual disturbances:    WNL  Orientation:  x4   Attention:  Good  Concentration:  Good  Memory:  intact  Fund of knowledge:   WNL  Insight:    Good  Judgment:   Good  Impulse Control:  Good   Reported Symptoms:  Depressive episodes, anxiety  Risk Assessment: Danger to Self:  denied Self-injurious Behavior: No Danger to Others: No Duty to Warn:no Physical Aggression / Violence:No  Access to Firearms a concern: No  Gang Involvement:No  Patient / guardian was educated about steps to take if suicide or homicide risk level increases between visits: yes While future psychiatric events cannot be accurately predicted, the patient does not currently require acute inpatient psychiatric care and does not currently meet [facility] involuntary commitment criteria.  Substance Abuse History: Current substance abuse: No     Past Psychiatric History:   Outpatient Providers:  [facility] History of Psych Hospitalization: No  Psychological Testing: none  Family History: Raised by both parents, oldest of 5 children, held to high standards by her mother. Not close to siblings overall.    Family History  Problem Relation Age of Onset  . Uterine cancer Mother 33  . Other Mother        brain tumor 2001  . Basal cell carcinoma Father   . Prostate cancer Father   . Other Father        sarcomatoid carcinoma  . Breast cancer Maternal Aunt   . Cancer Paternal Aunt   . Diabetes Paternal Grandmother   . Depression Maternal Grandmother   . Anxiety disorder  Brother     Medical History/Surgical History:  Past Medical History:  Diagnosis Date  . Depression   . Depression   . Endometriosis   . Fibroid     Past Surgical History:  Procedure Laterality Date  . DILATION AND CURETTAGE OF UTERUS    . LAPAROSCOPY ABDOMEN DIAGNOSTIC      Medications: Current Outpatient Medications  Medication Sig Dispense Refill  . buPROPion (WELLBUTRIN XL) 300 MG 24 hr tablet Take 1 tablet (300 mg total) by mouth daily. 30 tablet 1  . Cholecalciferol (VITAMIN D PO) Take by mouth daily.     . Multiple Vitamin (MULTIVITAMIN) tablet Take 1 tablet by mouth daily.    . NON FORMULARY 700 mg. Guduchi     . norethindrone-ethinyl estradiol-iron (JUNEL FE 1.5/30) 1.5-30 MG-MCG tablet TAKE 1 TABLET DAILY TAKE   CONTINUOUSLY FOR 2-3 MONTHSIN A ROW. 4 Package 3  . venlafaxine XR (EFFEXOR-XR) 37.5 MG 24 hr capsule Take 3 capsules (112.5 mg total) by mouth daily with breakfast. 270 capsule 0   No current facility-administered medications for this visit.    No Known Allergies  Abuse History: Victim: none Report needed: no Perpetrator of abuse: no Witness / Exposure to Domestic Violence:  none Protective Services Involvement: no Witness to [employer] Violence:  no   Family / Social History:    Living situation: lives alone Sexual Orientation: heterosexual Relationship Status:   single Name  of spouse / other: none If a parent, number of children / ages:   none  Support Systems: parents, friend- Tour manager Stress:  Some  Income/Employment/Disability:   Full time   Armed forces logistics/support/administrative officer: none  Educational History:   HS Diploma  Religion/Sprituality/World View:   Christian   Any cultural differences that may affect / interfere with treatment:  none  Recreation/Hobbies: None identified  Stressors:  interpersonal  Strengths:  Employment, support system  Barriers: none  Legal History: none  Pending legal issue / charges: none  History of legal  issue / charges: none    subjective:  Completed part of the assessment with patient continuing to assess history and needs. She got a new job and looks forward to a better schedule where she will be going home at 5 PM.  She says she plans to give her notice today at her current job.  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  When assessing more history, she stated that her mother often is "rigid" giving some examples of how her mother had high expectations, and even when patient would do well such as make a B on a test, her mother would ask her why she did not get an A.  She says she has not close with her siblings, keeps in contact but not often.  She stated her parents come and visit and plan to come next week. She has no real outlets to reduce stress currently; she was into photography, art club, bible study. Assisted her in identifying goals for treatment.  Encouraged her to consider her expectations, particularly when she feels that she struggles to manage her frustration or anger with others.  Explored ways where she can allow herself time to reflect prior to engaging in some situations such as at where she stated she tends to be reactive with customers at times.   Diagnoses:    ICD-10-CM   1. Mild episode of recurrent major depressive disorder ([place])  F33.0   2. Generalized anxiety disorder  F41.1     Plan: Patient is to use CBT, mindfulness and coping skills to help decrease symptoms associated with their diagnosis.     Long-term goal:   Reduce overall level, frequency, and intensity of the feelings of depression and anxiety in severity for at least 3 consecutive months per patient report.    Short-term goal:  Decrease over-thinking / rumination, such as after she has an interaction w/ others Improve in not taking others behaviors "too personally", referring to co-workers or customers Define her purpose in regards to a  career / school ambitions   Assessment of progress:  progressing       Anson [place], [facility]

## 2020-07-04 ENCOUNTER — Encounter: Payer: Self-pay | Admitting: Psychiatry

## 2020-07-04 ENCOUNTER — Other Ambulatory Visit: Payer: Self-pay

## 2020-07-04 ENCOUNTER — Ambulatory Visit (INDEPENDENT_AMBULATORY_CARE_PROVIDER_SITE_OTHER): Payer: No Typology Code available for payment source | Admitting: Psychiatry

## 2020-07-04 DIAGNOSIS — F411 Generalized anxiety disorder: Secondary | ICD-10-CM | POA: Diagnosis not present

## 2020-07-04 DIAGNOSIS — F330 Major depressive disorder, recurrent, mild: Secondary | ICD-10-CM | POA: Diagnosis not present

## 2020-07-04 MED ORDER — VENLAFAXINE HCL ER 37.5 MG PO CP24: 112.5000 mg | ORAL_CAPSULE | Freq: Every day | ORAL | 0 refills | Status: DC

## 2020-07-04 MED ORDER — BUPROPION HCL ER (XL) 300 MG PO TB24: 300.0000 mg | ORAL_TABLET | Freq: Every day | ORAL | 0 refills | Status: DC

## 2020-07-04 NOTE — Progress Notes (Signed)
Victoria Baxter [id number] [birth year] 49 y.o.  Subjective:   Patient ID:  Victoria Baxter is a 49 y.o. (DOB 1971/03/31) female.  Chief Complaint:  Chief Complaint  Patient presents with  . Follow-up    Depression, anxiety    HPI Victoria Baxter presents to the office today for follow-up of depression and anxiety. 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 She will be working Monday-Friday 8-5. Her energy has been lower after inventory at work last week and otherwise is good. She reports that her motivation is ok to take care of chores. She has not been interested in doing things or going places. Has not removed leaves. Denies excessive energy. She has been having some difficulty with sustained concentration and has not been able to read. She reports that she has been able to focus adequately at work. Sleeping well and denies over-sleeping. Appetite has been good. She reports that she has been better able to make healthier choices with eating. She denies anxiety or over-thinking. She notices some occasional irritability, typically with increased work stress. She reports that in certain situations she will match other people's anger and it is difficult to engage. She reports that she does not have a desire to engage socially. She reports that she is "even keel." She reports that she became tearful about learning that a kitten died that she fostered.  Denies SI.   Parents visited this weekend.   Past Psychiatric Medication Trials: Zoloft- Took 50 mg from 4/02-2/03 Pamelor- Took 10 mg 3/03-9/03 Celexa- Took 20 mg 9/03-10/03. Increased up to 40 mg qd Prozac- 20 mg 11/03 Lexapro- 20 mg 10/12-5/16. Initially effective and then no longer as effective.  Wellbutrin XL 150 mg- Has taken from June 2016-present. Notices increased mind racing when she does not take it.   Effexor XR- Took 75 mg June 2016. Increased by PCP to 150 mg po qd and this caused palpitations and insomnia. Has been somewhat helpful.  Pristiq Concerta- Worsening concentration and possible dissociation. Found herself driving in the middle of the road Abilify- Helpful for depression. Caused akathisia/tension.   May have taken Paxil  PHQ2-9     Office Visit from 05/02/2020 in [place] from 03/08/2020 in [place] from 12/03/2018 in [place] Visit from 05/01/2018 in [place] Visit from 04/07/2018 in [place]  PHQ-2 Total Score 0 6 0 1 4  PHQ-9 Total Score -- 25 -- -- 25       Review of Systems:  Review of Systems  Musculoskeletal: Negative for gait problem.  Neurological: Negative for tremors.       No change in chronic headaches and reports that it is less compared to when she lived in [place].   Psychiatric/Behavioral:       Please refer to HPI    Medications: I have reviewed the patient's current medications.  Current Outpatient Medications  Medication Sig Dispense Refill  . buPROPion (WELLBUTRIN XL) 300 MG 24 hr tablet Take 1 tablet (300 mg total) by mouth daily. 90 tablet 0  . Cholecalciferol (VITAMIN D PO) Take by mouth daily.     . Multiple Vitamin (MULTIVITAMIN) tablet Take 1 tablet by mouth daily.    . NON FORMULARY 700 mg. Guduchi     . norethindrone-ethinyl estradiol-iron (JUNEL FE 1.5/30) 1.5-30 MG-MCG tablet TAKE  1 TABLET DAILY TAKE   CONTINUOUSLY FOR 2-3 MONTHSIN A ROW. 4 Package 3  . venlafaxine XR (EFFEXOR-XR) 37.5 MG 24 hr capsule Take 3 capsules (112.5 mg total) by mouth daily with breakfast. 270 capsule 0   No current facility-administered medications for this visit.    Medication Side Effects: None  Allergies: No Known Allergies  Past Medical History:  Diagnosis Date  . Depression   . Depression   . Endometriosis   . Fibroid      Family History  Problem Relation Age of Onset  . Uterine cancer Mother 66  . Other Mother        brain tumor 2001  . Basal cell carcinoma Father   . Prostate cancer Father   . Other Father        sarcomatoid carcinoma  . Breast cancer Maternal Aunt   . Cancer Paternal Aunt   . Diabetes Paternal Grandmother   . Depression Maternal Grandmother   . Anxiety disorder Brother     Social History   Socioeconomic History  . Marital status: Single    Spouse name: Not on file  . Number of children: Not on file  . Years of education: Not on file  . Highest education level: Not on file  Occupational History  . Not on file  Tobacco Use  . Smoking status: Never Smoker  . Smokeless tobacco: Never Used  Vaping Use  . Vaping Use: Never used  Substance and Sexual Activity  . Alcohol use: Yes    Comment: once every other month  . Drug use: Never  . Sexual activity: Not Currently    Birth control/protection: Pill  Other Topics Concern  . Not on file  Social History Narrative  . Not on file   Social Determinants of Health   Financial Resource Strain:   . Difficulty of Paying Living Expenses: Not on file  Food Insecurity:   . Worried About Charity fundraiser in the Last Year: Not on file  . Ran Out of Food in the Last Year: Not on file  Transportation Needs:   . Lack of Transportation (Medical): Not on file  . Lack of Transportation (Non-Medical): Not on file  Physical Activity:   . Days of Exercise per Week: Not on file  . Minutes of Exercise per Session: Not on file  Stress:   . Feeling of Stress : Not on file  Social Connections:   . Frequency of Communication with Friends and Family: Not on file  . Frequency of Social Gatherings with Friends and Family: Not on file  . Attends Religious Services: Not on file  . Active Member of Clubs or Organizations: Not on file  . Attends Archivist Meetings: Not on file  . Marital Status: Not on file  Intimate Partner  Violence:   . Fear of Current or Ex-Partner: Not on file  . Emotionally Abused: Not on file  . Physically Abused: Not on file  . Sexually Abused: Not on file    Past Medical History, Surgical history, Social history, and Family history were reviewed and updated as appropriate.   Please see review of systems for further details on the patient's review from today.   Objective:   Physical Exam:  There were no vitals taken for this visit.  Physical Exam Constitutional:      General: She is not in acute distress. Musculoskeletal:        General: No deformity.  Neurological:  Mental Status: She is alert and oriented to person, place, and time.     Coordination: Coordination normal.  Psychiatric:        Attention and Perception: Attention and perception normal. She does not perceive auditory or visual hallucinations.        Mood and Affect: Mood normal. Mood is not anxious or depressed. Affect is not labile, blunt, angry or inappropriate.        Speech: Speech normal.        Behavior: Behavior normal.        Thought Content: Thought content normal. Thought content is not paranoid or delusional. Thought content does not include homicidal or suicidal ideation. Thought content does not include homicidal or suicidal plan.        Cognition and Memory: Cognition and memory normal.        Judgment: Judgment normal.     Comments: Insight intact     Lab Review:     Component Value Date/Time   NA 140 05/02/2020 0906   K 5.1 05/02/2020 0906   CL 104 05/02/2020 0906   CO2 22 05/02/2020 0906   GLUCOSE 94 05/02/2020 0906   BUN 13 05/02/2020 0906   CREATININE 0.95 05/02/2020 0906   CALCIUM 9.4 05/02/2020 0906   PROT 6.6 05/02/2020 0906   ALBUMIN 4.1 05/02/2020 0906   AST 18 05/02/2020 0906   ALT 21 05/02/2020 0906   ALKPHOS 93 05/02/2020 0906   BILITOT 0.5 05/02/2020 0906   GFRNONAA 70 05/02/2020 0906   GFRAA 80 05/02/2020 0906       Component Value Date/Time   WBC 6.2  03/17/2020 1337   RBC 5.01 03/17/2020 1337   HGB 15.9 03/17/2020 1337   HCT 46.2 03/17/2020 1337   PLT 334 03/17/2020 1337   MCV 92 03/17/2020 1337   MCH 31.7 03/17/2020 1337   MCHC 34.4 03/17/2020 1337   RDW 12.0 03/17/2020 1337   LYMPHSABS 2.3 03/17/2020 1337   EOSABS 0.2 03/17/2020 1337   BASOSABS 0.1 03/17/2020 1337    No results found for: POCLITH, LITHIUM   No results found for: PHENYTOIN, PHENOBARB, VALPROATE, CBMZ   .res Assessment: Plan:   Patient seen for 30 minutes and time spent counseling patient regarding possible treatment plan. Patient reports continuing to experience diminished interest in certain activities, particularly those that involve leaving home and/or interacting with others.  She reports that some of the symptoms may be related to her current job and is hopeful that the signs and symptoms improve with change in jobs next week.  She reports that she would like to continue current medications without changes at this time and re-assess residual depressive signs and symptoms after she has been at her new job for several weeks. Will continue Wellbutrin XL 300 mg daily for depression. Continue Effexor XR 112.5 mg po qd  for depression and anxiety. Recommend continuing psychotherapy with Lanetta Inch, [facility]. Patient to follow-up with this provider in 6 weeks or sooner if clinically indicated. Patient advised to contact office with any questions, adverse effects, or acute worsening in signs and symptoms.   Rivers was seen today for follow-up.  Diagnoses and all orders for this visit:  Mild episode of recurrent major depressive disorder (HCC) -     buPROPion (WELLBUTRIN XL) 300 MG 24 hr tablet; Take 1 tablet (300 mg total) by mouth daily. -     venlafaxine XR (EFFEXOR-XR) 37.5 MG 24 hr capsule; Take 3 capsules (112.5 mg total) by mouth  daily with breakfast.  Generalized anxiety disorder     Please see After Visit Summary for patient specific  instructions.  Future Appointments  Date Time Provider [place]  07/11/2020  8:00 AM Anson [place], [facility] CP-CP None  08/08/2020  8:00 AM Anson [place], [facility] CP-CP None  08/15/2020 10:00 AM Thayer Headings, PMHNP CP-CP None  09/14/2020  8:00 AM Salvadore Dom, MD [place] None    No orders of the defined types were placed in this encounter.   -------------------------------

## 2020-07-05 ENCOUNTER — Other Ambulatory Visit: Payer: Self-pay | Admitting: Psychiatry

## 2020-07-05 DIAGNOSIS — F331 Major depressive disorder, recurrent, moderate: Secondary | ICD-10-CM

## 2020-07-05 DIAGNOSIS — F411 Generalized anxiety disorder: Secondary | ICD-10-CM

## 2020-07-11 ENCOUNTER — Ambulatory Visit: Payer: No Typology Code available for payment source | Admitting: Mental Health

## 2020-07-20 ENCOUNTER — Encounter: Payer: No Typology Code available for payment source | Admitting: Gastroenterology

## 2020-07-21 ENCOUNTER — Other Ambulatory Visit: Payer: Self-pay | Admitting: Obstetrics and Gynecology

## 2020-07-21 DIAGNOSIS — Z3041 Encounter for surveillance of contraceptive pills: Secondary | ICD-10-CM

## 2020-07-21 NOTE — Telephone Encounter (Signed)
Medication refill request: Junel FE 28 Last AEX:  09/02/19 Next AEX: 09/14/20 Last MMG (if hormonal medication request): 05/17/20  Neg  Refill authorized: 90/0

## 2020-07-25 ENCOUNTER — Ambulatory Visit: Payer: No Typology Code available for payment source | Admitting: Mental Health

## 2020-08-08 ENCOUNTER — Ambulatory Visit: Payer: No Typology Code available for payment source | Admitting: Mental Health

## 2020-08-15 ENCOUNTER — Ambulatory Visit: Payer: No Typology Code available for payment source | Admitting: Psychiatry

## 2020-08-26 IMAGING — CR DG CHEST 2V
2 series · 2 of 2 positions shown · non-contrast
Comparison: None.

CLINICAL DATA: Cough for several weeks

EXAM:
CHEST - 2 VIEW

[w chest pa]
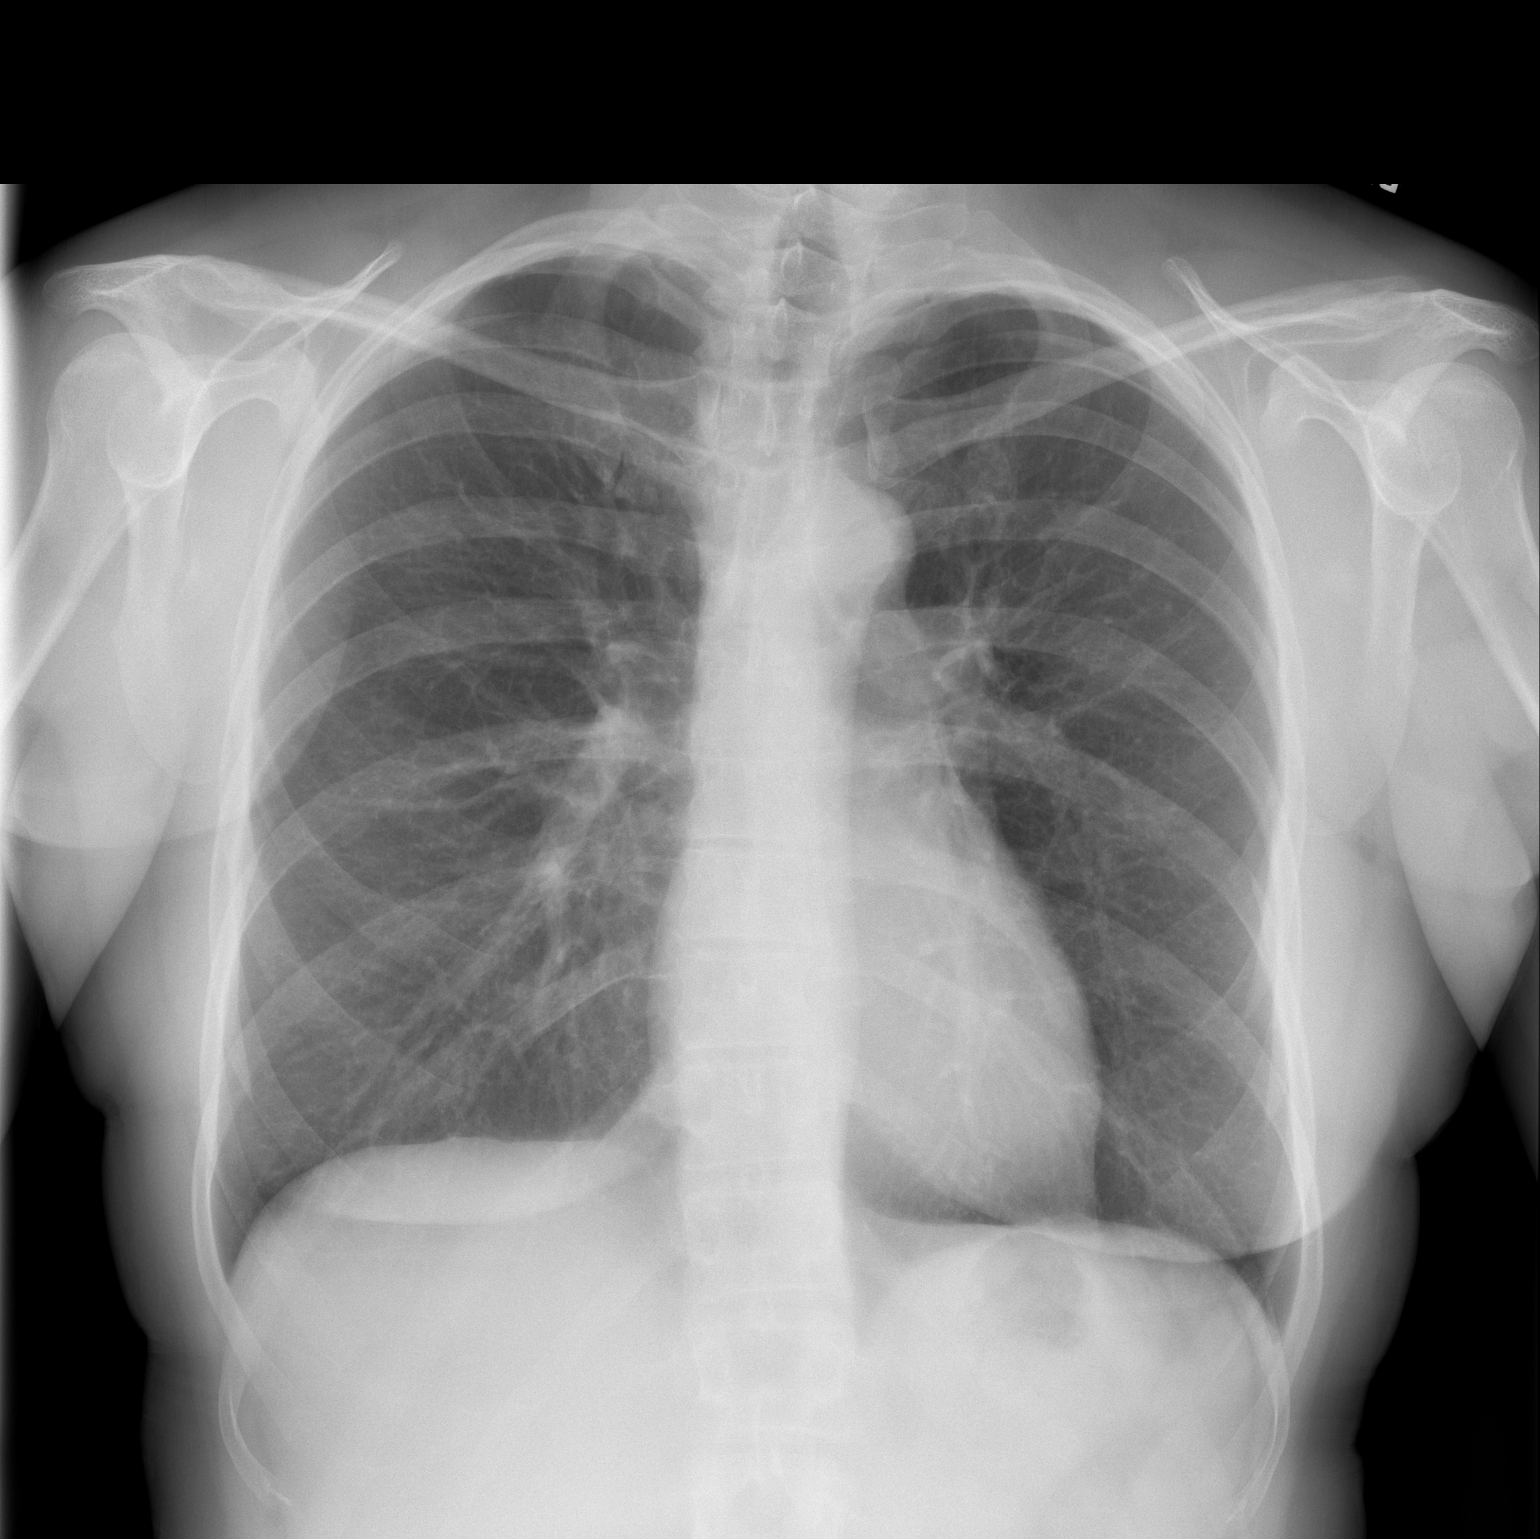

[w chest lat]
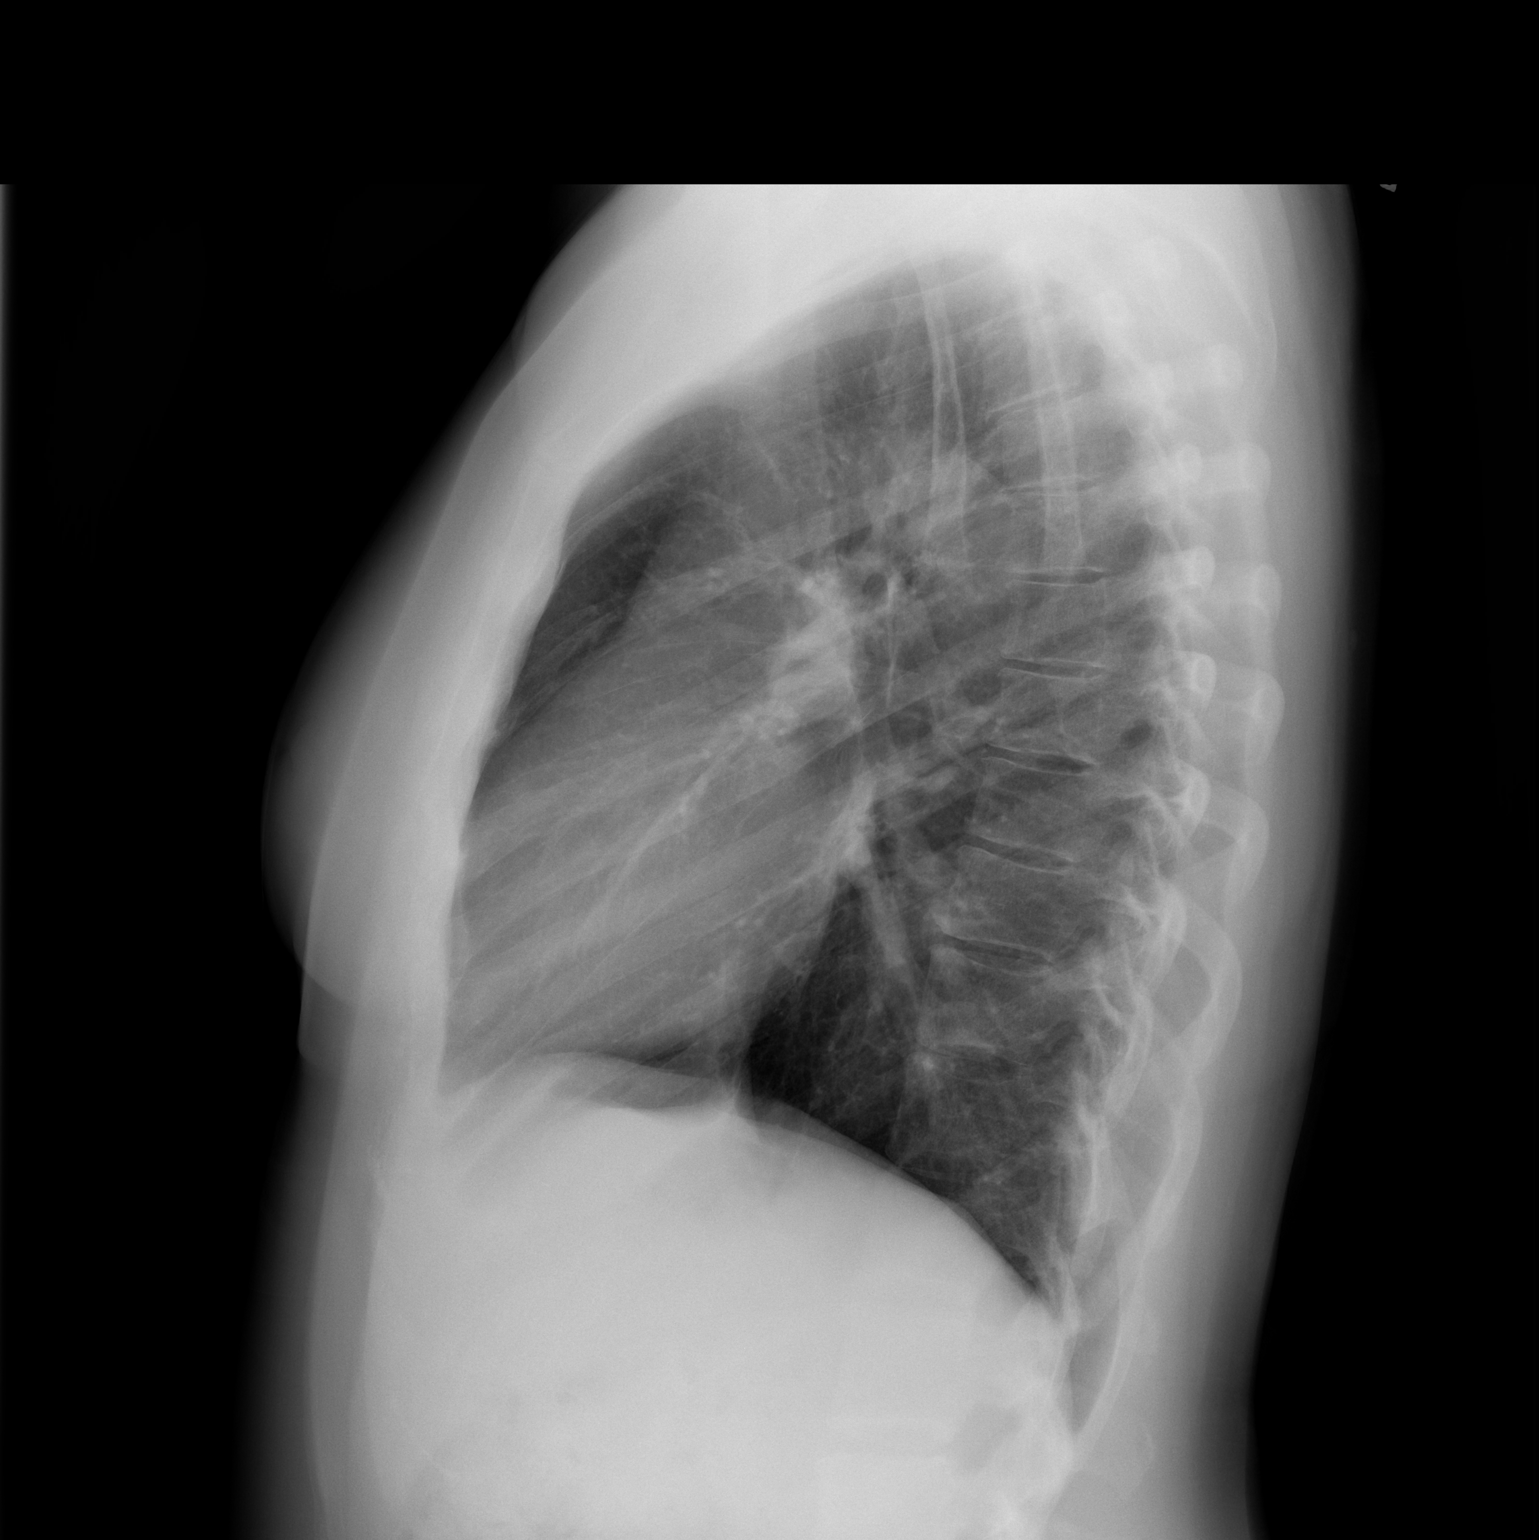

[2 of 2 positions shown; findings below may reference images not displayed]

FINDINGS: Normal heart size. Normal mediastinal contour. No pneumothorax. No
pleural effusion. Lungs appear clear, with no acute consolidative
airspace disease and no pulmonary edema.
IMPRESSION: No active cardiopulmonary disease.

## 2020-09-14 ENCOUNTER — Ambulatory Visit: Payer: No Typology Code available for payment source | Admitting: Obstetrics and Gynecology

## 2020-10-06 ENCOUNTER — Telehealth: Payer: Self-pay | Admitting: Psychiatry

## 2020-10-06 ENCOUNTER — Telehealth: Payer: Self-pay

## 2020-10-06 DIAGNOSIS — Z3041 Encounter for surveillance of contraceptive pills: Secondary | ICD-10-CM

## 2020-10-06 MED ORDER — NORETHIN ACE-ETH ESTRAD-FE 1.5-30 MG-MCG PO TABS: ORAL_TABLET | ORAL | 0 refills | Status: DC

## 2020-10-06 NOTE — Telephone Encounter (Signed)
Patient called stating she has changed her ins to [facility]. She would like for her prescriptions to now be sent to [facility] through home delivery escribed program. Medications are Wellburtrin and Arts development officer.

## 2020-10-06 NOTE — Telephone Encounter (Signed)
Patient called.  Needs OC refill sent to [facility] as her insurance has changed.  She was scheduled to see you in January but her employer had "messed up" her insurance and it took time to get it all worked out. She is scheduled not for 11/11/20.  She will run out of Rx before that date.

## 2020-10-06 NOTE — Telephone Encounter (Signed)
Script sent  

## 2020-10-07 ENCOUNTER — Other Ambulatory Visit: Payer: Self-pay

## 2020-10-07 DIAGNOSIS — F330 Major depressive disorder, recurrent, mild: Secondary | ICD-10-CM

## 2020-10-07 MED ORDER — VENLAFAXINE HCL ER 37.5 MG PO CP24: 112.5000 mg | ORAL_CAPSULE | Freq: Every day | ORAL | 0 refills | Status: DC

## 2020-10-07 MED ORDER — BUPROPION HCL ER (XL) 300 MG PO TB24: 300.0000 mg | ORAL_TABLET | Freq: Every day | ORAL | 0 refills | Status: DC

## 2020-10-07 NOTE — Telephone Encounter (Signed)
Rx's for Wellbutrin and Effexor submitted per last office note to [facility]

## 2020-11-11 ENCOUNTER — Encounter: Payer: Self-pay | Admitting: Obstetrics and Gynecology

## 2020-11-11 ENCOUNTER — Other Ambulatory Visit: Payer: Self-pay

## 2020-11-11 ENCOUNTER — Ambulatory Visit: Payer: BC Managed Care – PPO | Admitting: Obstetrics and Gynecology

## 2020-11-11 VITALS — BP 110/60 | HR 76 | Ht 69.25 in | Wt 177.0 lb

## 2020-11-11 DIAGNOSIS — Z01419 Encounter for gynecological examination (general) (routine) without abnormal findings: Secondary | ICD-10-CM | POA: Diagnosis not present

## 2020-11-11 DIAGNOSIS — Z3009 Encounter for other general counseling and advice on contraception: Secondary | ICD-10-CM | POA: Diagnosis not present

## 2020-11-11 DIAGNOSIS — D259 Leiomyoma of uterus, unspecified: Secondary | ICD-10-CM

## 2020-11-11 DIAGNOSIS — N809 Endometriosis, unspecified: Secondary | ICD-10-CM

## 2020-11-11 DIAGNOSIS — Z3041 Encounter for surveillance of contraceptive pills: Secondary | ICD-10-CM

## 2020-11-11 NOTE — Patient Instructions (Signed)

## 2020-11-11 NOTE — Progress Notes (Signed)
50 y.o. G0P0000 Single White or Caucasian Not Hispanic or Latino female here for annual exam.   On OCP's to suppress endometriosis. She is getting a cycle every 3 months. Still gets a headache the week prior to her cycle and with her cycle. No vasomotor symptoms.  Not sexually active.  Period Cycle (Days):  (every 3 months) Period Duration (Days): 3-4 Period Pattern: Regular Menstrual Flow: Light Menstrual Control: Thin pad Menstrual Control Change Freq (Hours): 12 Dysmenorrhea: None   Records reviewed: -U/S 4/12: 5 fibroids, 4 pedunculated, one posterior myometrium (1.4 cm , 3 cm, 7.9 cm, 3 cm, 4.2 cm). Overal uterus not measured  -On review of records mention of history of galactorrhea -After her endometriosis surgery she was treated with danzol for 6 months -Hysteroscopy, D&C 03/24/07, no intracavitary defects -03/19/11, Laparoscopy with rupture of left ovarian cyst and multiple biopsies, mild diffuse pelvic endometriosis was seen and fibroids were noted. It was a "chocholate" cyst (not removed, just aspirated). Several brown lesions were noted on the parietal peritoneum lateral to the lever, suspicious for endometriosis.  Patient's last menstrual period was 10/11/2020.          Sexually active: No.  The current method of family planning is OCP (estrogen/progesterone).    Exercising: Yes.    The patient has a physically strenuous job, but has no regular exercise apart from work.  Smoker:  no  Health Maintenance: Pap:  07/24/18 Neg:Neg HR HPV History of abnormal Pap:  no MMG:  05/17/20 BIRADS 1 negative/density c BMD:   none Colonoscopy: patient is due, she will schedule it.  TDaP:  05/01/18 Gardasil: n/a   reports that she has never smoked. She has never used smokeless tobacco. She reports current alcohol use. She reports that she does not use drugs. Rare ETOH. 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   Past Medical History:  Diagnosis Date  . Depression   .  Depression   . Endometriosis   . Fibroid     Past Surgical History:  Procedure Laterality Date  . DILATION AND CURETTAGE OF UTERUS    . LAPAROSCOPY ABDOMEN DIAGNOSTIC      Current Outpatient Medications  Medication Sig Dispense Refill  . buPROPion (WELLBUTRIN XL) 300 MG 24 hr tablet Take 1 tablet (300 mg total) by mouth daily. 90 tablet 0  . Cholecalciferol (VITAMIN D PO) Take by mouth daily.     . Multiple Vitamin (MULTIVITAMIN) tablet Take 1 tablet by mouth daily.    . NON FORMULARY 700 mg. Guduchi    . norethindrone-ethinyl estradiol-iron (JUNEL FE 1.5/30) 1.5-30 MG-MCG tablet TAKE 1 TABLET DAILY, TAKE  CONTINUOUSLY FOR 2-3 MONTHSIN A ROW 112 tablet 0  . venlafaxine XR (EFFEXOR-XR) 37.5 MG 24 hr capsule Take 3 capsules (112.5 mg total) by mouth daily with breakfast. 270 capsule 0   No current facility-administered medications for this visit.    Family History  Problem Relation Age of Onset  . Uterine cancer Mother 68  . Other Mother        brain tumor 2001  . Basal cell carcinoma Father   . Prostate cancer Father   . Other Father        sarcomatoid carcinoma  . Breast cancer Maternal Aunt   . Cancer Paternal Aunt   . Diabetes Paternal Grandmother   . Depression Maternal Grandmother   . Anxiety disorder Brother     Review of Systems  Constitutional: Negative.   HENT: Negative.   Eyes:  Negative.   Respiratory: Negative.   Cardiovascular: Negative.   Gastrointestinal: Negative.   Endocrine: Negative.   Genitourinary: Negative.   Musculoskeletal: Negative.   Skin: Negative.   Allergic/Immunologic: Negative.   Neurological: Negative.   Hematological: Negative.   Psychiatric/Behavioral: Negative.     Exam:   BP 110/60   Pulse 76   Ht 5' 9.25" (1.759 m)   Wt 177 lb (80.3 kg)   LMP 10/11/2020   SpO2 97%   BMI 25.95 kg/m   Weight change: @WEIGHTCHANGE @ Height:   Height: 5' 9.25" (175.9 cm)  Ht Readings from Last 3 Encounters:  11/11/20 5' 9.25" (1.759 m)   06/08/20 5\' 10"  (1.778 m)  05/17/20 5\' 10"  (1.778 m)    General appearance: alert, cooperative and appears stated age Head: Normocephalic, without obvious abnormality, atraumatic Neck: no adenopathy, supple, symmetrical, trachea midline and thyroid normal to inspection and palpation Lungs: clear to auscultation bilaterally Cardiovascular: regular rate and rhythm Breasts: normal appearance, no masses or tenderness Abdomen: soft, non-tender; non distended,  no masses,  no organomegaly Extremities: extremities normal, atraumatic, no cyanosis or edema Skin: Skin color, texture, turgor normal. No rashes or lesions Lymph nodes: Cervical, supraclavicular, and axillary nodes normal. No abnormal inguinal nodes palpated Neurologic: Grossly normal   Pelvic: External genitalia:  no lesions              Urethra:  normal appearing urethra with no masses, tenderness or lesions              Bartholins and Skenes: normal                 Vagina: normal appearing vagina with normal color and discharge, no lesions              Cervix: no lesions               Bimanual Exam:  Uterus:  retroverted, ~10 week sized (stable), irregular.               Adnexa: no mass, fullness, tenderness               Rectovaginal: Confirms               Anus:  normal sphincter tone, no lesions    1. Well woman exam Discussed breast self exam Discussed calcium and vit D intake No pap this year Mammogram UTD Due for colonoscopy, she will schedule.   2. General counseling and advice on female contraception Discussed a trial off of OCP's. Will stop OCP's and check an [place] one week later. Further plans depending on the results [facility]; Future  3. Endometriosis Symptoms controlled on OCP's  4. Uterine leiomyoma, unspecified location Exam stable  5. Encounter for surveillance of contraceptive pills See above.

## 2020-11-15 ENCOUNTER — Encounter: Payer: Self-pay | Admitting: Obstetrics and Gynecology

## 2020-11-18 ENCOUNTER — Other Ambulatory Visit: Payer: Self-pay

## 2020-11-18 ENCOUNTER — Other Ambulatory Visit (INDEPENDENT_AMBULATORY_CARE_PROVIDER_SITE_OTHER): Payer: BC Managed Care – PPO

## 2020-11-18 DIAGNOSIS — Z3009 Encounter for other general counseling and advice on contraception: Secondary | ICD-10-CM | POA: Diagnosis not present

## 2020-11-19 LAB — FOLLICLE STIMULATING HORMONE: FSH: 18.8 m[IU]/mL

## 2020-11-21 ENCOUNTER — Other Ambulatory Visit: Payer: Self-pay | Admitting: Obstetrics and Gynecology

## 2020-11-21 DIAGNOSIS — Z3041 Encounter for surveillance of contraceptive pills: Secondary | ICD-10-CM

## 2020-11-21 MED ORDER — NORETHIN ACE-ETH ESTRAD-FE 1.5-30 MG-MCG PO TABS: ORAL_TABLET | ORAL | 3 refills | Status: DC

## 2020-12-13 DIAGNOSIS — R059 Cough, unspecified: Secondary | ICD-10-CM | POA: Diagnosis not present

## 2020-12-13 DIAGNOSIS — U071 COVID-19: Secondary | ICD-10-CM | POA: Diagnosis not present

## 2020-12-13 DIAGNOSIS — B349 Viral infection, unspecified: Secondary | ICD-10-CM | POA: Diagnosis not present

## 2021-01-10 DIAGNOSIS — J40 Bronchitis, not specified as acute or chronic: Secondary | ICD-10-CM | POA: Diagnosis not present

## 2021-01-10 DIAGNOSIS — R059 Cough, unspecified: Secondary | ICD-10-CM | POA: Diagnosis not present

## 2021-01-22 ENCOUNTER — Other Ambulatory Visit: Payer: Self-pay | Admitting: Psychiatry

## 2021-01-22 DIAGNOSIS — F330 Major depressive disorder, recurrent, mild: Secondary | ICD-10-CM

## 2021-01-24 NOTE — Telephone Encounter (Signed)
Last apt 07/2020 due back 6 weeks

## 2021-01-25 ENCOUNTER — Other Ambulatory Visit: Payer: Self-pay | Admitting: Psychiatry

## 2021-01-25 DIAGNOSIS — F330 Major depressive disorder, recurrent, mild: Secondary | ICD-10-CM

## 2021-01-26 NOTE — Telephone Encounter (Signed)
Called Pt. LM on VM to schedule follow up apt. Over due

## 2021-03-16 ENCOUNTER — Other Ambulatory Visit: Payer: Self-pay

## 2021-03-16 ENCOUNTER — Ambulatory Visit (INDEPENDENT_AMBULATORY_CARE_PROVIDER_SITE_OTHER): Payer: BC Managed Care – PPO | Admitting: Psychiatry

## 2021-03-16 ENCOUNTER — Encounter: Payer: Self-pay | Admitting: Psychiatry

## 2021-03-16 DIAGNOSIS — F330 Major depressive disorder, recurrent, mild: Secondary | ICD-10-CM | POA: Diagnosis not present

## 2021-03-16 MED ORDER — BUPROPION HCL ER (XL) 300 MG PO TB24: 300.0000 mg | ORAL_TABLET | Freq: Every day | ORAL | 1 refills | Status: DC

## 2021-03-16 NOTE — Progress Notes (Signed)
Victoria Baxter [id number] 07-25-1971 50 y.o.  Subjective:   Patient ID:  Victoria Baxter is a 50 y.o. (DOB May 18, 1971) female.  Chief Complaint:  Chief Complaint  Patient presents with   Anxiety   Depression     HPI Victoria Baxter presents to the office today for follow-up of depression and anxiety. She switched jobs and there was an issue with her insurance getting started. 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 She reports that she is enjoying this and it is less stressful. She is also enjoying her coworkers. She has less anxiety now that stress has decreased. Denies depressed mood. "I still sleep a lot." Reports taking a 30 minute naps when she gets home. Sleeps 8 hours during the week. Will sleep longer on the weekends. She reports that she will spend time with friends but otherwise does not feel like going out and socializing. Reports that her energy and motivation were good during trip to see family. She reports that energy and motivation have been lower with elevated temps and humidity. Appetite has been good. She reports that she is having to focus more after not doing accounting in awhile and is having to write numbers down instead of doing calculations in her head. Denies SI. She reports occasional thoughts of, "I would rather not be here."   She is enjoying fostering kittens. Went to [place] state in early June.   Past Psychiatric Medication Trials: Zoloft- Took 50 mg from 4/02-2/03 Pamelor- Took 10 mg 3/03-9/03 Celexa- Took 20 mg 9/03-10/03. Increased up to 40 mg qd Prozac- 20 mg 11/03 Lexapro- 20 mg 10/12-5/16. Initially effective and then no longer as effective. Wellbutrin XL 150 mg- Has taken from June 2016-present. Notices increased mind racing when she does not take it. Effexor XR- Took 75 mg June 2016. Increased by PCP to 150 mg po qd and this caused palpitations and insomnia. Has  been somewhat helpful. Pristiq Concerta- Worsening concentration and possible dissociation. Found herself driving in the middle of the road Abilify- Helpful for depression. Caused akathisia/tension.    May have taken [place] Office Visit from 05/02/2020 in [place] from 03/08/2020 in [place] from 12/03/2018 in [place] Visit from 05/01/2018 in [place] Visit from 04/07/2018 in [place]  PHQ-2 Total Score 0 6 0 1 4  PHQ-9 Total Score -- 25 -- -- 25        Review of Systems:  Review of Systems  Musculoskeletal:  Negative for gait problem.  Neurological:  Negative for tremors.  Psychiatric/Behavioral:         Please refer to HPI   Medications: I have reviewed the patient's current medications.  Current Outpatient Medications  Medication Sig Dispense Refill   Cholecalciferol (VITAMIN D PO) Take by mouth daily.      Multiple Vitamin (MULTIVITAMIN) tablet Take 1 tablet by mouth daily.     NON FORMULARY 700 mg. Guduchi     norethindrone-ethinyl estradiol-iron (JUNEL FE 1.5/30) 1.5-30 MG-MCG tablet TAKE 1 TABLET DAILY, TAKE  CONTINUOUSLY 112 tablet 3   venlafaxine XR (EFFEXOR-XR) 37.5 MG 24 hr capsule TAKE 3 CAPSULES DAILY WITH BREAKFAST 270 capsule 0   buPROPion (WELLBUTRIN XL) 300 MG 24 hr tablet Take 1 tablet (300 mg total) by mouth daily. 90 tablet 1   No current facility-administered medications for this  visit.    Medication Side Effects: None  Allergies: No Known Allergies  Past Medical History:  Diagnosis Date   Depression    Depression    Endometriosis    Fibroid     Past Medical History, Surgical history, Social history, and Family history were reviewed and updated as appropriate.   Please see review of systems for further details on the patient's review from today.   Objective:   Physical Exam:  There were no  vitals taken for this visit.  Physical Exam Constitutional:      General: She is not in acute distress. Musculoskeletal:        General: No deformity.  Neurological:     Mental Status: She is alert and oriented to person, place, and time.     Coordination: Coordination normal.  Psychiatric:        Attention and Perception: Attention and perception normal. She does not perceive auditory or visual hallucinations.        Mood and Affect: Mood normal. Mood is not anxious or depressed. Affect is not labile, blunt, angry or inappropriate.        Speech: Speech normal.        Behavior: Behavior normal.        Thought Content: Thought content normal. Thought content is not paranoid or delusional. Thought content does not include homicidal or suicidal ideation. Thought content does not include homicidal or suicidal plan.        Cognition and Memory: Cognition and memory normal.        Judgment: Judgment normal.     Comments: Insight intact    Lab Review:     Component Value Date/Time   NA 140 05/02/2020 0906   K 5.1 05/02/2020 0906   CL 104 05/02/2020 0906   CO2 22 05/02/2020 0906   GLUCOSE 94 05/02/2020 0906   BUN 13 05/02/2020 0906   CREATININE 0.95 05/02/2020 0906   CALCIUM 9.4 05/02/2020 0906   PROT 6.6 05/02/2020 0906   ALBUMIN 4.1 05/02/2020 0906   AST 18 05/02/2020 0906   ALT 21 05/02/2020 0906   ALKPHOS 93 05/02/2020 0906   BILITOT 0.5 05/02/2020 0906   GFRNONAA 70 05/02/2020 0906   GFRAA 80 05/02/2020 0906       Component Value Date/Time   WBC 6.2 03/17/2020 1337   RBC 5.01 03/17/2020 1337   HGB 15.9 03/17/2020 1337   HCT 46.2 03/17/2020 1337   PLT 334 03/17/2020 1337   MCV 92 03/17/2020 1337   MCH 31.7 03/17/2020 1337   MCHC 34.4 03/17/2020 1337   RDW 12.0 03/17/2020 1337   LYMPHSABS 2.3 03/17/2020 1337   EOSABS 0.2 03/17/2020 1337   BASOSABS 0.1 03/17/2020 1337    No results found for: POCLITH, LITHIUM   No results found for: PHENYTOIN, PHENOBARB,  VALPROATE, CBMZ   .res Assessment: Plan:   Pt seen for 30 minutes and time spent reviewing medical and social history since last visit on 07/04/20. She reports that mood and anxiety s/s have been stable and would like to continue current medications. She reports that she does not think that she needs therapy at this time and will notify office if this changes in the future.  Continue Wellbutrin XL 300 mg po qd for depression. Continue Effexor XR 37.5 mg three capsules po qd for mood and anxiety.  Pt to follow-up in 6 months or sooner if clinically indicated.  Patient advised to contact office with any questions, adverse effects, or  acute worsening in signs and symptoms.  Victoria Baxter was seen today for anxiety and depression.  Diagnoses and all orders for this visit:  Mild episode of recurrent major depressive disorder (HCC) -     buPROPion (WELLBUTRIN XL) 300 MG 24 hr tablet; Take 1 tablet (300 mg total) by mouth daily.    Please see After Visit Summary for patient specific instructions.  Future Appointments  Date Time Provider [place]  09/14/2021  8:30 AM Thayer Headings, PMHNP [facility] None  11/13/2021  3:00 PM Salvadore Dom, MD [facility] None    No orders of the defined types were placed in this encounter.   -------------------------------

## 2021-04-12 ENCOUNTER — Encounter: Payer: Self-pay | Admitting: Family Medicine

## 2021-04-13 ENCOUNTER — Other Ambulatory Visit: Payer: Self-pay | Admitting: Internal Medicine

## 2021-04-13 DIAGNOSIS — Z1211 Encounter for screening for malignant neoplasm of colon: Secondary | ICD-10-CM

## 2021-04-30 ENCOUNTER — Other Ambulatory Visit: Payer: Self-pay | Admitting: Psychiatry

## 2021-04-30 DIAGNOSIS — F330 Major depressive disorder, recurrent, mild: Secondary | ICD-10-CM

## 2021-05-10 ENCOUNTER — Telehealth: Payer: Self-pay

## 2021-05-10 NOTE — Telephone Encounter (Signed)
Prior Authorization submitted for the quantity of VENLAFAXINE ER 37.5 MG #270/90 DAY. Approval received effective 05/10/2021-05/09/2022 with [employer] [id number]

## 2021-07-11 ENCOUNTER — Telehealth: Payer: Self-pay | Admitting: Psychiatry

## 2021-07-11 NOTE — Telephone Encounter (Signed)
Pt called reporting she has insurance change to [employer] and mail pharmacy is now [facility]. Please update in epic and send Rx for Venlafaxine XR 37.5 mg 3/d with breakfast. And Bupropion XL 300 mg 1/d. Pt has set up account with [facility].   Rx Bin: [id number]      Pcn: [id number]      Grp: [employer]Benefit

## 2021-07-14 ENCOUNTER — Other Ambulatory Visit: Payer: Self-pay

## 2021-07-14 DIAGNOSIS — F330 Major depressive disorder, recurrent, mild: Secondary | ICD-10-CM

## 2021-07-14 MED ORDER — VENLAFAXINE HCL ER 37.5 MG PO CP24: ORAL_CAPSULE | ORAL | 3 refills | Status: DC

## 2021-07-14 MED ORDER — BUPROPION HCL ER (XL) 300 MG PO TB24: 300.0000 mg | ORAL_TABLET | Freq: Every day | ORAL | 1 refills | Status: DC

## 2021-07-14 NOTE — Telephone Encounter (Signed)
Updated [place] and Rx's sent

## 2021-09-14 ENCOUNTER — Ambulatory Visit (INDEPENDENT_AMBULATORY_CARE_PROVIDER_SITE_OTHER): Payer: No Typology Code available for payment source | Admitting: Psychiatry

## 2021-09-14 ENCOUNTER — Encounter: Payer: Self-pay | Admitting: Psychiatry

## 2021-09-14 ENCOUNTER — Other Ambulatory Visit: Payer: Self-pay

## 2021-09-14 DIAGNOSIS — F330 Major depressive disorder, recurrent, mild: Secondary | ICD-10-CM | POA: Diagnosis not present

## 2021-09-14 MED ORDER — VENLAFAXINE HCL ER 75 MG PO CP24: 75.0000 mg | ORAL_CAPSULE | Freq: Every day | ORAL | 1 refills | Status: DC

## 2021-09-14 MED ORDER — BUPROPION HCL ER (XL) 300 MG PO TB24: 300.0000 mg | ORAL_TABLET | Freq: Every day | ORAL | 1 refills | Status: DC

## 2021-09-14 NOTE — Progress Notes (Signed)
Victoria Baxter [id number] 08-10-1971 51 y.o.  Subjective:   Patient ID:  Victoria Baxter is a 51 y.o. (DOB 06-Jan-1971) female.  Chief Complaint:  Chief Complaint  Patient presents with   Follow-up    Depression    HPI [facility] [place] presents to the office today for follow-up of depression. 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 Mood has been "good." She reports that she prefers to stay ay home on the weekends and "lay low." Has not gotten back into art and has an art room. She communicates with friends regularly and is not interested in getting together in person. She is not interested in reading. Has been trying to be more intentional with some things, such as eating healthier.  She reports anhedonia. She reports that she experiences irritability. She reports that she experiences less sadness compared to baseline. Has not been able to cry. Denies anxiety. She reports that her house is "always a mess." She reports that her supervisor commented that her office needed to look more organized. She reports "it used to bother me and would be all organized." She reports some difficulty with concentration and will "skip around" while reading. Difficulty sustaining focus. Energy is ok. Motivation is low. Reports different tasks seem "too boring" and does not want to do anything repetitive.  Sleeping well. Denies sleeping for extended periods of time. Denies SI.    Past Psychiatric Medication Trials: Zoloft- Took 50 mg from 4/02-2/03 Pamelor- Took 10 mg 3/03-9/03 Celexa- Took 20 mg 9/03-10/03. Increased up to 40 mg qd Prozac- 20 mg 11/03 Lexapro- 20 mg 10/12-5/16. Initially effective and then no longer as effective. Wellbutrin XL 150 mg- Has taken from June 2016-present. Notices increased mind racing when she does not take it. Effexor XR- Took 75 mg June 2016. Increased by PCP to 150 mg po qd and this caused palpitations and insomnia. Has  been somewhat helpful. Pristiq Concerta- Worsening concentration and possible dissociation. Found herself driving in the middle of the road Abilify- Helpful for depression. Caused akathisia/tension.    May have taken [place] Office Visit from 05/02/2020 in [place] from 03/08/2020 in [place] from 12/03/2018 in [place] Visit from 05/01/2018 in [place] Visit from 04/07/2018 in [place]  PHQ-2 Total Score 0 6 0 1 4  PHQ-9 Total Score -- 25 -- -- 25        Review of Systems:  Review of Systems  Cardiovascular:  Negative for palpitations.  Musculoskeletal:  Negative for gait problem.  Neurological:  Negative for tremors.  Psychiatric/Behavioral:         Please refer to HPI   Medications: I have reviewed the patient's current medications.  Current Outpatient Medications  Medication Sig Dispense Refill   Cholecalciferol (VITAMIN D PO) Take by mouth daily.      Multiple Vitamin (MULTIVITAMIN) tablet Take 1 tablet by mouth daily.     NON FORMULARY 700 mg. Guduchi     buPROPion (WELLBUTRIN XL) 300 MG 24 hr tablet Take 1 tablet (300 mg total) by mouth daily. 90 tablet 1   norethindrone-ethinyl estradiol-iron (JUNEL FE 1.5/30) 1.5-30 MG-MCG tablet TAKE 1 TABLET DAILY, TAKE  CONTINUOUSLY 112 tablet 0   venlafaxine XR (EFFEXOR-XR) 75 MG 24 hr capsule Take 1 capsule (75 mg total) by mouth daily with breakfast. 90 capsule 1  No current facility-administered medications for this visit.    Medication Side Effects: Other: Affective dulling  Allergies: No Known Allergies  Past Medical History:  Diagnosis Date   Depression    Depression    Endometriosis    Fibroid     Past Medical History, Surgical history, Social history, and Family history were reviewed and updated as appropriate.   Please see review of systems for further details on the  patient's review from today.   Objective:   Physical Exam:  There were no vitals taken for this visit.  Physical Exam Constitutional:      General: She is not in acute distress. Musculoskeletal:        General: No deformity.  Neurological:     Mental Status: She is alert and oriented to person, place, and time.     Coordination: Coordination normal.  Psychiatric:        Attention and Perception: Attention and perception normal. She does not perceive auditory or visual hallucinations.        Mood and Affect: Mood is not anxious. Affect is not labile, blunt, angry or inappropriate.        Speech: Speech normal.        Behavior: Behavior normal.        Thought Content: Thought content normal. Thought content is not paranoid or delusional. Thought content does not include homicidal or suicidal ideation. Thought content does not include homicidal or suicidal plan.        Cognition and Memory: Cognition and memory normal.        Judgment: Judgment normal.     Comments: Insight intact Dysthymic mood    Lab Review:     Component Value Date/Time   NA 140 05/02/2020 0906   K 5.1 05/02/2020 0906   CL 104 05/02/2020 0906   CO2 22 05/02/2020 0906   GLUCOSE 94 05/02/2020 0906   BUN 13 05/02/2020 0906   CREATININE 0.95 05/02/2020 0906   CALCIUM 9.4 05/02/2020 0906   PROT 6.6 05/02/2020 0906   ALBUMIN 4.1 05/02/2020 0906   AST 18 05/02/2020 0906   ALT 21 05/02/2020 0906   ALKPHOS 93 05/02/2020 0906   BILITOT 0.5 05/02/2020 0906   GFRNONAA 70 05/02/2020 0906   GFRAA 80 05/02/2020 0906       Component Value Date/Time   WBC 6.2 03/17/2020 1337   RBC 5.01 03/17/2020 1337   HGB 15.9 03/17/2020 1337   HCT 46.2 03/17/2020 1337   PLT 334 03/17/2020 1337   MCV 92 03/17/2020 1337   MCH 31.7 03/17/2020 1337   MCHC 34.4 03/17/2020 1337   RDW 12.0 03/17/2020 1337   LYMPHSABS 2.3 03/17/2020 1337   EOSABS 0.2 03/17/2020 1337   BASOSABS 0.1 03/17/2020 1337    No results found for:  POCLITH, LITHIUM   No results found for: PHENYTOIN, PHENOBARB, VALPROATE, CBMZ   .res Assessment: Plan:   Discussed that she is describing possible affective dulling which can occur at higher doses of SSRI's and occasionally SNRI's. Discussed that decreasing dose of Effexor XR could potentially improve affective dulling. Pt is in agreement with dose reduction of Effexor XR. Discussed considering a different medication if anhedonia, diminished interest, and low motivation do not improve.  Will decrease Effexor XR to 75 mg po qd.  Continue Wellbutrin XL 300 mg po qd for depression.  Pt to follow-up in 6 months or sooner if clinically indicated.  Patient advised to contact office with any questions, adverse effects, or  acute worsening in signs and symptoms.  T[place] was seen today for follow-up.  Diagnoses and all orders for this visit:  Mild episode of recurrent major depressive disorder (HCC) -     venlafaxine XR (EFFEXOR-XR) 75 MG 24 hr capsule; Take 1 capsule (75 mg total) by mouth daily with breakfast. -     buPROPion (WELLBUTRIN XL) 300 MG 24 hr tablet; Take 1 tablet (300 mg total) by mouth daily.     Please see After Visit Summary for patient specific instructions.  Future Appointments  Date Time Provider [place]  11/28/2021  3:00 PM Salvadore Dom, MD [facility] None  03/14/2022  8:30 AM Thayer Headings, PMHNP [facility] None    No orders of the defined types were placed in this encounter.   -------------------------------

## 2021-09-15 ENCOUNTER — Telehealth: Payer: Self-pay | Admitting: *Deleted

## 2021-09-15 DIAGNOSIS — Z3041 Encounter for surveillance of contraceptive pills: Secondary | ICD-10-CM

## 2021-09-15 MED ORDER — NORETHIN ACE-ETH ESTRAD-FE 1.5-30 MG-MCG PO TABS: ORAL_TABLET | ORAL | 0 refills | Status: DC

## 2021-09-15 NOTE — Telephone Encounter (Signed)
Patient called reports she has a new mail pharmacy called [facility], requesting birth control pills sent to this pharmacy. Rx sent.  Annual exam scheduled on 07/31/22 Last annual exam was 11/2020 Last mammogram 05/2020  I asked patient to update her mammogram as well.

## 2021-09-21 ENCOUNTER — Encounter: Payer: Self-pay | Admitting: Obstetrics and Gynecology

## 2021-10-14 ENCOUNTER — Other Ambulatory Visit: Payer: Self-pay | Admitting: Obstetrics and Gynecology

## 2021-10-14 DIAGNOSIS — Z3041 Encounter for surveillance of contraceptive pills: Secondary | ICD-10-CM

## 2021-11-13 ENCOUNTER — Ambulatory Visit: Payer: Self-pay | Admitting: Obstetrics and Gynecology

## 2021-11-15 ENCOUNTER — Encounter: Payer: Self-pay | Admitting: Obstetrics and Gynecology

## 2021-11-20 NOTE — Progress Notes (Signed)
51 y.o. G0P0000 Single White or Caucasian Not Hispanic or Latino female here for annual exam.  On OCP's to suppress endometriosis. Gives herself a cycle every 2 months. ?Period Cycle (Days): 56 ?Period Duration (Days): 5 ?Period Pattern: Regular ?Menstrual Flow: Light ?Menstrual Control: Thin pad ?Menstrual Control Change Freq (Hours): 8 ?Dysmenorrhea: None ?Dysmenorrhea Symptoms: Headache ?She gets bad headaches when she is off the active pills. No auras. ?No vasomotor symptoms.  ? ?H/O fibroid uterus. U/S 4/12: 5 fibroids, 4 pedunculated, one posterior myometrium (1.4 cm , 3 cm, 7.9 cm, 3 cm, 4.2 cm). Overal uterus not measured. On exam last year her uterus was RV and ~10 week sized.   ? ?Patient's last menstrual period was 11/21/2021.          ?Sexually active: No.  ?The current method of family planning is OCP (estrogen/progesterone).    ?Exercising: No.  The patient does not participate in regular exercise at present. ?Smoker:  no ? ?Health Maintenance: ?Pap:  07/24/18 Neg:Neg HR HPV ?History of abnormal Pap:  no ?MMG:  05/23/20 Density C Bi-rads 1 neg  ?BMD:   none  ?Colonoscopy: many years ago  ?TDaP:  2019  ?Gardasil: n/a ? ? reports that she has never smoked. She has never used smokeless tobacco. She reports current alcohol use. She reports that she does not use drugs. 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 ? ?Past Medical History:  ?Diagnosis Date  ? Depression   ? Depression   ? Endometriosis   ? Fibroid   ? ? ?Past Surgical History:  ?Procedure Laterality Date  ? DILATION AND CURETTAGE OF UTERUS    ? LAPAROSCOPY ABDOMEN DIAGNOSTIC    ? ? ?Current Outpatient Medications  ?Medication Sig Dispense Refill  ? buPROPion (WELLBUTRIN XL) 300 MG 24 hr tablet Take 1 tablet (300 mg total) by mouth daily. 90 tablet 1  ? Cholecalciferol (VITAMIN D PO) Take by mouth daily.     ? Multiple Vitamin (MULTIVITAMIN) tablet Take 1 tablet by mouth daily.    ? NON FORMULARY 700 mg. Guduchi    ? norethindrone-ethinyl  estradiol-iron (JUNEL FE 1.5/30) 1.5-30 MG-MCG tablet TAKE 1 TABLET DAILY, TAKE  CONTINUOUSLY 112 tablet 0  ? venlafaxine XR (EFFEXOR-XR) 75 MG 24 hr capsule Take 1 capsule (75 mg total) by mouth daily with breakfast. 90 capsule 1  ? ?No current facility-administered medications for this visit.  ? ? ?Family History  ?Problem Relation Age of Onset  ? Uterine cancer Mother 65  ? Other Mother   ?     brain tumor 2001  ? Basal cell carcinoma Father   ? Prostate cancer Father   ? Other Father   ?     sarcomatoid carcinoma  ? Breast cancer Maternal Aunt   ? Cancer Paternal Aunt   ? Diabetes Paternal Grandmother   ? Depression Maternal Grandmother   ? Anxiety disorder Brother   ? ? ?Review of Systems  ?All other systems reviewed and are negative. ? ?Exam:   ?BP 134/82   Pulse 82   Ht '5\' 9"'$  (1.753 m)   Wt 182 lb (82.6 kg)   LMP 11/21/2021   SpO2 100%   BMI 26.88 kg/m?   Weight change: '@WEIGHTCHANGE'$ @ Height:   Height: '5\' 9"'$  (175.3 cm)  ?Ht Readings from Last 3 Encounters:  ?11/28/21 '5\' 9"'$  (1.753 m)  ?11/11/20 5' 9.25" (1.759 m)  ?06/08/20 '5\' 10"'$  (1.778 m)  ? ? ?General appearance: alert, cooperative and appears stated  age ?Head: Normocephalic, without obvious abnormality, atraumatic ?Neck: no adenopathy, supple, symmetrical, trachea midline and thyroid normal to inspection and palpation ?Lungs: clear to auscultation bilaterally ?Cardiovascular: regular rate and rhythm ?Breasts: normal appearance, no masses or tenderness ?Abdomen: soft, non-tender; non distended,  no masses,  no organomegaly ?Extremities: extremities normal, atraumatic, no cyanosis or edema ?Skin: Skin color, texture, turgor normal. No rashes or lesions ?Lymph nodes: Cervical, supraclavicular, and axillary nodes normal. ?No abnormal inguinal nodes palpated ?Neurologic: Grossly normal ? ? ?Pelvic: External genitalia:  no lesions ?             Urethra:  normal appearing urethra with no masses, tenderness or lesions ?             Bartholins and Skenes:  normal    ?             Vagina: normal appearing vagina with normal color and discharge, no lesions ?             Cervix: no lesions ?              ?Bimanual Exam:  Uterus:   retroverted, irregular, ~10 week sized (stable), decreased mobility ?             Adnexa: no mass, fullness, tenderness ?              Rectovaginal: Confirms ?              Anus:  normal sphincter tone, no lesions ? ?Gae Dry chaperoned for the exam. ? ?1. Well woman exam ?Mammogram overdue, she will schedule ?Discussed breast self exam ?Discussed calcium and vit D intake ? ?2. General counseling and advice on female contraception ?Will check if her [place] is elevated prior to prescribing contraception ?- Follicle stimulating hormone ? ?3. Encounter for surveillance of contraceptive pills ?If [place] is low, will change pills to loseasonique (headaches on placebo pills). If high consider trial off of OCP's ? ?4. Screening for cervical cancer ?- Cytology - PAP ? ?5. Colon cancer screening ?- Ambulatory referral to Gastroenterology ? ?6. Laboratory exam ordered as part of routine general medical examination ?- CBC ?- Comprehensive metabolic panel ?- Lipid panel ? ?7. Elevated LDL cholesterol level ?- Lipid panel ? ? ?

## 2021-11-21 ENCOUNTER — Other Ambulatory Visit: Payer: Self-pay | Admitting: Obstetrics and Gynecology

## 2021-11-21 DIAGNOSIS — Z3041 Encounter for surveillance of contraceptive pills: Secondary | ICD-10-CM

## 2021-11-28 ENCOUNTER — Encounter: Payer: Self-pay | Admitting: Obstetrics and Gynecology

## 2021-11-28 ENCOUNTER — Other Ambulatory Visit: Payer: Self-pay

## 2021-11-28 ENCOUNTER — Ambulatory Visit (INDEPENDENT_AMBULATORY_CARE_PROVIDER_SITE_OTHER): Payer: No Typology Code available for payment source | Admitting: Obstetrics and Gynecology

## 2021-11-28 ENCOUNTER — Other Ambulatory Visit (HOSPITAL_COMMUNITY)
Admission: RE | Admit: 2021-11-28 | Discharge: 2021-11-28 | Disposition: A | Discharge status: Home / Self Care | Payer: No Typology Code available for payment source | Source: Ambulatory Visit | Attending: Obstetrics and Gynecology | Admitting: Obstetrics and Gynecology

## 2021-11-28 VITALS — BP 134/82 | HR 82 | Ht 69.0 in | Wt 182.0 lb

## 2021-11-28 DIAGNOSIS — Z3009 Encounter for other general counseling and advice on contraception: Secondary | ICD-10-CM | POA: Diagnosis not present

## 2021-11-28 DIAGNOSIS — Z01419 Encounter for gynecological examination (general) (routine) without abnormal findings: Secondary | ICD-10-CM

## 2021-11-28 DIAGNOSIS — Z0000 Encounter for general adult medical examination without abnormal findings: Secondary | ICD-10-CM

## 2021-11-28 DIAGNOSIS — Z124 Encounter for screening for malignant neoplasm of cervix: Secondary | ICD-10-CM | POA: Diagnosis not present

## 2021-11-28 DIAGNOSIS — Z3041 Encounter for surveillance of contraceptive pills: Secondary | ICD-10-CM | POA: Diagnosis not present

## 2021-11-28 DIAGNOSIS — Z1211 Encounter for screening for malignant neoplasm of colon: Secondary | ICD-10-CM

## 2021-11-28 DIAGNOSIS — E7800 Pure hypercholesterolemia, unspecified: Secondary | ICD-10-CM

## 2021-11-28 NOTE — Patient Instructions (Signed)

## 2021-11-29 LAB — LIPID PANEL
Cholesterol: 234 mg/dL — ABNORMAL HIGH (ref <200)
HDL: 57 mg/dL (ref >=50)
LDL Cholesterol (Calc): 131 mg/dL (calc) — ABNORMAL HIGH
Non-HDL Cholesterol (Calc): 177 mg/dL (calc) — ABNORMAL HIGH (ref <130)
Total CHOL/HDL Ratio: 4.1 (calc) (ref <5.0)
Triglycerides: 324 mg/dL — ABNORMAL HIGH (ref <150)

## 2021-11-29 LAB — COMPREHENSIVE METABOLIC PANEL
AG Ratio: 1.7 (calc) (ref 1.0–2.5)
ALT: 37 U/L — ABNORMAL HIGH (ref 6–29)
AST: 24 U/L (ref 10–35)
Albumin: 4.5 g/dL (ref 3.6–5.1)
Alkaline phosphatase (APISO): 97 U/L (ref 37–153)
BUN: 25 mg/dL (ref 7–25)
CO2: 22 mmol/L (ref 20–32)
Calcium: 9.8 mg/dL (ref 8.6–10.4)
Chloride: 106 mmol/L (ref 98–110)
Creat: 0.89 mg/dL (ref 0.50–1.03)
Globulin: 2.6 g/dL (calc) (ref 1.9–3.7)
Glucose, Bld: 95 mg/dL (ref 65–99)
Potassium: 4.2 mmol/L (ref 3.5–5.3)
Sodium: 140 mmol/L (ref 135–146)
Total Bilirubin: 0.5 mg/dL (ref 0.2–1.2)
Total Protein: 7.1 g/dL (ref 6.1–8.1)

## 2021-11-29 LAB — CBC
HCT: 46.6 % — ABNORMAL HIGH (ref 35.0–45.0)
Hemoglobin: 15.9 g/dL — ABNORMAL HIGH (ref 11.7–15.5)
MCH: 31 pg (ref 27.0–33.0)
MCHC: 34.1 g/dL (ref 32.0–36.0)
MCV: 90.8 fL (ref 80.0–100.0)
MPV: 9.7 fL (ref 7.5–12.5)
Platelets: 324 10*3/uL (ref 140–400)
RBC: 5.13 10*6/uL — ABNORMAL HIGH (ref 3.80–5.10)
RDW: 12.1 % (ref 11.0–15.0)
WBC: 6.9 10*3/uL (ref 3.8–10.8)

## 2021-11-29 LAB — FOLLICLE STIMULATING HORMONE: FSH: 21.5 m[IU]/mL

## 2021-11-29 LAB — CYTOLOGY - PAP
Comment: NEGATIVE
Diagnosis: NEGATIVE
High risk HPV: NEGATIVE

## 2021-11-30 ENCOUNTER — Encounter: Payer: Self-pay | Admitting: *Deleted

## 2021-11-30 ENCOUNTER — Encounter: Payer: Self-pay | Admitting: Obstetrics and Gynecology

## 2021-12-04 ENCOUNTER — Other Ambulatory Visit: Payer: Self-pay | Admitting: Obstetrics and Gynecology

## 2021-12-04 DIAGNOSIS — E785 Hyperlipidemia, unspecified: Secondary | ICD-10-CM

## 2021-12-04 DIAGNOSIS — R7989 Other specified abnormal findings of blood chemistry: Secondary | ICD-10-CM

## 2021-12-04 MED ORDER — LEVONORGEST-ETH ESTRAD 91-DAY 0.1-0.02 & 0.01 MG PO TABS: 1.0000 | ORAL_TABLET | Freq: Every day | ORAL | 4 refills | Status: DC

## 2021-12-05 ENCOUNTER — Telehealth: Payer: Self-pay

## 2021-12-05 NOTE — Telephone Encounter (Signed)
[facility] faxed a clarification request for change of medication from [place] 1.5/30 Tab to [place] 0.1-0.'02mg'$ /0.'01mg'$  tab. Will place on Dr. Gentry Fitz desk to authorize and will return fax.  ?

## 2021-12-07 NOTE — Telephone Encounter (Signed)
Dr. Talbert Nan authorized and faxed back to [facility].  ?

## 2022-03-02 ENCOUNTER — Encounter: Payer: Self-pay | Admitting: Internal Medicine

## 2022-03-07 ENCOUNTER — Other Ambulatory Visit: Payer: Self-pay | Admitting: Psychiatry

## 2022-03-07 DIAGNOSIS — F330 Major depressive disorder, recurrent, mild: Secondary | ICD-10-CM

## 2022-03-14 ENCOUNTER — Ambulatory Visit: Payer: No Typology Code available for payment source | Admitting: Psychiatry

## 2022-04-03 ENCOUNTER — Ambulatory Visit: Payer: No Typology Code available for payment source | Admitting: Psychiatry

## 2022-04-17 ENCOUNTER — Ambulatory Visit (INDEPENDENT_AMBULATORY_CARE_PROVIDER_SITE_OTHER): Payer: No Typology Code available for payment source | Admitting: Psychiatry

## 2022-04-17 ENCOUNTER — Other Ambulatory Visit: Payer: Self-pay | Admitting: Psychiatry

## 2022-04-17 ENCOUNTER — Encounter: Payer: Self-pay | Admitting: Psychiatry

## 2022-04-17 DIAGNOSIS — F330 Major depressive disorder, recurrent, mild: Secondary | ICD-10-CM

## 2022-04-17 DIAGNOSIS — F411 Generalized anxiety disorder: Secondary | ICD-10-CM

## 2022-04-17 MED ORDER — AUVELITY 45-105 MG PO TBCR: 1.0000 | EXTENDED_RELEASE_TABLET | Freq: Two times a day (BID) | ORAL | 2 refills | Status: DC

## 2022-04-17 MED ORDER — VENLAFAXINE HCL ER 75 MG PO CP24: 75.0000 mg | ORAL_CAPSULE | Freq: Every day | ORAL | 0 refills | Status: DC

## 2022-04-17 NOTE — Progress Notes (Signed)
Victoria Baxter [id number] 1971/06/27 51 y.o.  Subjective:   Patient ID:  Victoria Baxter is a 51 y.o. (DOB 25-Mar-1971) female.  Chief Complaint:  Chief Complaint  Patient presents with   Follow-up    Depression    HPI Victoria Baxter presents to the office today for follow-up of depression. She reports that decrease in Effexor "seems to be better." She is recognizing that her body needs a significant amount of sleep and feels good on long weekends and vacations when she has additional time to sleep. She reports that when she has adequate sleep she has moments where she enjoys things and feels content. She is usually in bed by 8:30-9 pm and looks at her phone for about an hour and then wakes up around 5:30-6 am. She reports sleeping in her spare time. She reports feeling exhausted on the weekend. Energy is usually better in the morning. Motivation has been ok. She has been keeping up with housework more and keeping her home more neat and organized. She reports occasional sad moments and hopeless thoughts. Denies any significant anxiety. Appetite has been good. She reports chronic difficulty with concentration. She reports that she jumps between tasks. She reports some SI a few months ago. She has reached out to a friend when this occurs. Denies suicidal intent. Denies any recent SI.   She reports that she forgot her medication yesterday and experienced brain zaps. She reports that she typically does not forget to take her medications.   Fosters kittens.   Bought a new car recently.   Past Psychiatric Medication Trials: Zoloft- Took 50 mg from 4/02-2/03 Pamelor- Took 10 mg 3/03-9/03 Celexa- Took 20 mg 9/03-10/03. Increased up to 40 mg qd Prozac- 20 mg 11/03 Lexapro- 20 mg 10/12-5/16. Initially effective and then no longer as effective. Wellbutrin XL 150 mg- Has taken from June 2016-present. Notices increased mind racing when she does not take it. Effexor XR- Took 75 mg June 2016.  Increased by PCP to 150 mg po qd and this caused palpitations and insomnia. Has been somewhat helpful. Pristiq Concerta- Worsening concentration and possible dissociation. Found herself driving in the middle of the road Abilify- Helpful for depression. Caused akathisia/tension.    May have taken [place] Office Visit from 05/02/2020 in Victoria Baxter from 03/08/2020 in Victoria Baxter from 12/03/2018 in Victoria Baxter Visit from 05/01/2018 in Victoria Baxter Visit from 04/07/2018 in Victoria Baxter  PHQ-2 Total Score 0 6 0 1 4  PHQ-9 Total Score -- 25 -- -- 25        Review of Systems:  Review of Systems  Musculoskeletal:  Negative for gait problem.  Neurological:  Negative for tremors and headaches.  Psychiatric/Behavioral:         Please refer to HPI    Medications: I have reviewed the patient's current medications.  Current Outpatient Medications  Medication Sig Dispense Refill   Cholecalciferol (VITAMIN D PO) Take by mouth daily.      Dextromethorphan-buPROPion ER (AUVELITY) 45-105 MG TBCR Take 1 tablet by mouth 2 (two) times daily. 60 tablet 2   Levonorgestrel-Ethinyl Estradiol (LOSEASONIQUE) 0.1-0.02 & 0.01 MG tablet Take 1 tablet by mouth daily. 91 tablet 4   Multiple Vitamin (MULTIVITAMIN) tablet Take 1 tablet by mouth daily.     NON FORMULARY 700 mg. Guduchi     venlafaxine XR (EFFEXOR-XR) 75 MG 24 hr capsule Take 1 capsule (  75 mg total) by mouth daily with breakfast. 90 capsule 0   No current facility-administered medications for this visit.    Medication Side Effects: None  Allergies: No Known Allergies  Past Medical History:  Diagnosis Date   Depression    Depression    Endometriosis    Fibroid     Past Medical History, Surgical history, Social history, and Family history were reviewed and updated as appropriate.   Please see review of systems  for further details on the patient's review from today.   Objective:   Physical Exam:  There were no vitals taken for this visit.  Physical Exam Constitutional:      General: She is not in acute distress. Musculoskeletal:        General: No deformity.  Neurological:     Mental Status: She is alert and oriented to person, place, and time.     Coordination: Coordination normal.  Psychiatric:        Attention and Perception: Attention and perception normal. She does not perceive auditory or visual hallucinations.        Mood and Affect: Mood is depressed. Mood is not anxious. Affect is not labile, blunt, angry or inappropriate.        Speech: Speech normal.        Behavior: Behavior normal.        Thought Content: Thought content normal. Thought content is not paranoid or delusional. Thought content does not include homicidal or suicidal ideation. Thought content does not include homicidal or suicidal plan.        Cognition and Memory: Cognition and memory normal.        Judgment: Judgment normal.     Comments: Insight intact     Lab Review:     Component Value Date/Time   NA 140 11/28/2021 1531   NA 140 05/02/2020 0906   K 4.2 11/28/2021 1531   CL 106 11/28/2021 1531   CO2 22 11/28/2021 1531   GLUCOSE 95 11/28/2021 1531   BUN 25 11/28/2021 1531   BUN 13 05/02/2020 0906   CREATININE 0.89 11/28/2021 1531   CALCIUM 9.8 11/28/2021 1531   PROT 7.1 11/28/2021 1531   PROT 6.6 05/02/2020 0906   ALBUMIN 4.1 05/02/2020 0906   AST 24 11/28/2021 1531   ALT 37 (H) 11/28/2021 1531   ALKPHOS 93 05/02/2020 0906   BILITOT 0.5 11/28/2021 1531   BILITOT 0.5 05/02/2020 0906   GFRNONAA 70 05/02/2020 0906   GFRAA 80 05/02/2020 0906       Component Value Date/Time   WBC 6.9 11/28/2021 1531   RBC 5.13 (H) 11/28/2021 1531   HGB 15.9 (H) 11/28/2021 1531   HGB 15.9 03/17/2020 1337   HCT 46.6 (H) 11/28/2021 1531   HCT 46.2 03/17/2020 1337   PLT 324 11/28/2021 1531   PLT 334 03/17/2020  1337   MCV 90.8 11/28/2021 1531   MCV 92 03/17/2020 1337   MCH 31.0 11/28/2021 1531   MCHC 34.1 11/28/2021 1531   RDW 12.1 11/28/2021 1531   RDW 12.0 03/17/2020 1337   LYMPHSABS 2.3 03/17/2020 1337   EOSABS 0.2 03/17/2020 1337   BASOSABS 0.1 03/17/2020 1337    No results found for: "POCLITH", "LITHIUM"   No results found for: "PHENYTOIN", "PHENOBARB", "VALPROATE", "CBMZ"   .res Assessment: Plan:    Patient seen for 30 minutes and time spent discussing treatment options for depression.Discussed potential benefits, risks, and side effects of Auvelity. Will start Auvelity 45-105 mg one tablet  daily for 3 days, then increase Auvelity to one tablet twice daily for depression.  Will discontinue Wellbutrin since Auvelity contains bupropion. Will continue venlafaxine 75 mg daily for depression. Pt to follow-up in 6 months or sooner if clinically indicated.  Patient advised to contact office with any questions, adverse effects, or acute worsening in signs and symptoms.   Wynn was seen today for follow-up.  Diagnoses and all orders for this visit:  Mild episode of recurrent major depressive disorder (HCC) -     venlafaxine XR (EFFEXOR-XR) 75 MG 24 hr capsule; Take 1 capsule (75 mg total) by mouth daily with breakfast. -     Dextromethorphan-buPROPion ER (AUVELITY) 45-105 MG TBCR; Take 1 tablet by mouth 2 (two) times daily.  Generalized anxiety disorder     Please see After Visit Summary for patient specific instructions.  Future Appointments  Date Time Provider [place]  10/18/2022  8:30 AM Thayer Headings, PMHNP [facility] None  12/03/2022  3:00 PM Salvadore Dom, MD [facility] None    No orders of the defined types were placed in this encounter.   -------------------------------

## 2022-04-17 NOTE — Telephone Encounter (Signed)
PA needed

## 2022-04-18 NOTE — Telephone Encounter (Signed)
Prior authorization initiated with RX benefits for AUVELITY 45-105 MG #60   EOC# [id number]  Will send office notes when available.

## 2022-04-25 NOTE — Telephone Encounter (Signed)
Office notes were sent for review

## 2022-04-26 ENCOUNTER — Telehealth: Payer: Self-pay

## 2022-04-26 NOTE — Telephone Encounter (Signed)
Prior Approval received for AUVELITY 45-105 MG effective 04/25/2022-04/25/2023 with RX Benefits.

## 2022-05-09 ENCOUNTER — Encounter: Payer: Self-pay | Admitting: Internal Medicine

## 2022-06-12 ENCOUNTER — Encounter: Payer: Self-pay | Admitting: Internal Medicine

## 2022-06-25 ENCOUNTER — Other Ambulatory Visit: Payer: Self-pay | Admitting: Psychiatry

## 2022-06-25 DIAGNOSIS — F330 Major depressive disorder, recurrent, mild: Secondary | ICD-10-CM

## 2022-07-17 ENCOUNTER — Encounter: Payer: Self-pay | Admitting: Internal Medicine

## 2022-08-15 ENCOUNTER — Other Ambulatory Visit: Payer: Self-pay | Admitting: Psychiatry

## 2022-08-15 ENCOUNTER — Telehealth: Payer: Self-pay | Admitting: Psychiatry

## 2022-08-15 DIAGNOSIS — F330 Major depressive disorder, recurrent, mild: Secondary | ICD-10-CM

## 2022-08-15 NOTE — Telephone Encounter (Signed)
I do see a PA was already done and approved.Please advise

## 2022-08-15 NOTE — Telephone Encounter (Signed)
Victoria Baxter called and said that her refill on Auvelity is going to cost her $100 month and she can't afford this. Could someone please call her at [phone].

## 2022-08-15 NOTE — Telephone Encounter (Signed)
I contacted the pharmacy to make sure she had a co-pay card on file and she did, the pharmacist sent it through and it's still $10.00 month.   No sure where the high cost amount came from.

## 2022-08-15 NOTE — Telephone Encounter (Signed)
Pt informed

## 2022-09-05 ENCOUNTER — Other Ambulatory Visit: Payer: Self-pay | Admitting: Psychiatry

## 2022-09-05 DIAGNOSIS — F330 Major depressive disorder, recurrent, mild: Secondary | ICD-10-CM

## 2022-09-16 ENCOUNTER — Encounter: Payer: Self-pay | Admitting: Obstetrics and Gynecology

## 2022-10-18 ENCOUNTER — Ambulatory Visit: Payer: No Typology Code available for payment source | Admitting: Psychiatry

## 2022-10-18 ENCOUNTER — Encounter: Payer: Self-pay | Admitting: Psychiatry

## 2022-10-18 DIAGNOSIS — F3342 Major depressive disorder, recurrent, in full remission: Secondary | ICD-10-CM | POA: Diagnosis not present

## 2022-10-18 MED ORDER — AUVELITY 45-105 MG PO TBCR: 1.0000 | EXTENDED_RELEASE_TABLET | Freq: Two times a day (BID) | ORAL | 3 refills | Status: DC

## 2022-10-18 MED ORDER — VENLAFAXINE HCL ER 75 MG PO CP24: 75.0000 mg | ORAL_CAPSULE | Freq: Every day | ORAL | 3 refills | Status: DC

## 2022-10-18 NOTE — Progress Notes (Signed)
Victoria Baxter [id number] July 10, 1971 52 y.o.  Subjective:   Patient ID:  Victoria Baxter is a 52 y.o. (DOB 01-07-1971) female.  Chief Complaint:  Chief Complaint  Patient presents with   Follow-up    Depression    HPI Victoria Baxter presents to the office today for follow-up of depression. She reports that she had headaches initially with Auvelity and this resolved. She reports that Victoria Baxter has been helpful for her depression and enjoyment in things. She has been going out and enjoying things on the weekends. She has been following through with social plans. Denies persistent depression. Anxiety has been manageable. She reports that energy and motivation have improved. She has picked up books at [employer]. Sleeping 7-8 hours a night. No longer sleeping excessively. Appetite has been good. Concentration has been difficult at times and attributes this to having to multi-task at work. She reports that she has noticed a desire to be more intentional. Denies SI.   She reports that she has noticed her energy and motivation are better when she eats less carbs.   Her father had a mild stroke while visiting.   She reports stress at work with layoffs. She has had additional responsibilities placed on her at work without additional pay. She is working 40-50 hours a week.   She reports that she had some anxiety in response to feeling excluded form some things that lasted about 24 hours.   Has been fostering kittens.   Past Psychiatric Medication Trials: Zoloft- Took 50 mg from 4/02-2/03 Pamelor- Took 10 mg 3/03-9/03 Celexa- Took 20 mg 9/03-10/03. Increased up to 40 mg qd Prozac- 20 mg 11/03 Lexapro- 20 mg 10/12-5/16. Initially effective and then no longer as effective. Wellbutrin XL 150 mg- Has taken from June 2016-present. Notices increased mind racing when she does not take it. Effexor XR- Took 75 mg June 2016. Increased by PCP to 150 mg po qd and this caused palpitations and  insomnia. Has been somewhat helpful. Pristiq Concerta- Worsening concentration and possible dissociation. Found herself driving in the middle of the road Abilify- Helpful for depression. Caused akathisia/tension.    May have taken [place] Office Visit from 05/02/2020 in [place] from 03/08/2020 in [place] from 12/03/2018 in [place] Visit from 05/01/2018 in [place] Visit from 04/07/2018 in [place]  PHQ-2 Total Score 0 6 0 1 4  PHQ-9 Total Score -- 25 -- -- 25        Review of Systems:  Review of Systems  Musculoskeletal:  Negative for gait problem.  Neurological:  Negative for tremors and headaches.  Psychiatric/Behavioral:         Please refer to HPI    Medications: I have reviewed the patient's current medications.  Current Outpatient Medications  Medication Sig Dispense Refill   Cholecalciferol (VITAMIN D PO) Take by mouth daily.      Levonorgestrel-Ethinyl Estradiol (LOSEASONIQUE) 0.1-0.02 & 0.01 MG tablet Take 1 tablet by mouth daily. 91 tablet 4   Multiple Vitamin (MULTIVITAMIN) tablet Take 1 tablet by mouth daily.     NON FORMULARY 700 mg. Guduchi     Dextromethorphan-buPROPion ER (AUVELITY) 45-105 MG TBCR Take 1 tablet by mouth 2 (two) times daily. 180 tablet 3   venlafaxine XR (EFFEXOR-XR) 75 MG 24 hr capsule Take 1 capsule (75 mg total) by mouth daily with breakfast. 90 capsule 3   No current  facility-administered medications for this visit.    Medication Side Effects: None  Allergies: No Known Allergies  Past Medical History:  Diagnosis Date   Depression    Depression    Endometriosis    Fibroid     Past Medical History, Surgical history, Social history, and Family history were reviewed and updated as appropriate.   Please see review of systems for further details on the patient's review from today.    Objective:   Physical Exam:  There were no vitals taken for this visit.  Physical Exam Constitutional:      General: She is not in acute distress. Musculoskeletal:        General: No deformity.  Neurological:     Mental Status: She is alert and oriented to person, place, and time.     Coordination: Coordination normal.  Psychiatric:        Attention and Perception: Attention and perception normal. She does not perceive auditory or visual hallucinations.        Mood and Affect: Mood normal. Mood is not anxious or depressed. Affect is not labile, blunt, angry or inappropriate.        Speech: Speech normal.        Behavior: Behavior normal.        Thought Content: Thought content normal. Thought content is not paranoid or delusional. Thought content does not include homicidal or suicidal ideation. Thought content does not include homicidal or suicidal plan.        Cognition and Memory: Cognition and memory normal.        Judgment: Judgment normal.     Comments: Insight intact     Lab Review:     Component Value Date/Time   NA 140 11/28/2021 1531   NA 140 05/02/2020 0906   K 4.2 11/28/2021 1531   CL 106 11/28/2021 1531   CO2 22 11/28/2021 1531   GLUCOSE 95 11/28/2021 1531   BUN 25 11/28/2021 1531   BUN 13 05/02/2020 0906   CREATININE 0.89 11/28/2021 1531   CALCIUM 9.8 11/28/2021 1531   PROT 7.1 11/28/2021 1531   PROT 6.6 05/02/2020 0906   ALBUMIN 4.1 05/02/2020 0906   AST 24 11/28/2021 1531   ALT 37 (H) 11/28/2021 1531   ALKPHOS 93 05/02/2020 0906   BILITOT 0.5 11/28/2021 1531   BILITOT 0.5 05/02/2020 0906   GFRNONAA 70 05/02/2020 0906   GFRAA 80 05/02/2020 0906       Component Value Date/Time   WBC 6.9 11/28/2021 1531   RBC 5.13 (H) 11/28/2021 1531   HGB 15.9 (H) 11/28/2021 1531   HGB 15.9 03/17/2020 1337   HCT 46.6 (H) 11/28/2021 1531   HCT 46.2 03/17/2020 1337   PLT 324 11/28/2021 1531   PLT 334 03/17/2020 1337   MCV 90.8 11/28/2021 1531   MCV 92  03/17/2020 1337   MCH 31.0 11/28/2021 1531   MCHC 34.1 11/28/2021 1531   RDW 12.1 11/28/2021 1531   RDW 12.0 03/17/2020 1337   LYMPHSABS 2.3 03/17/2020 1337   EOSABS 0.2 03/17/2020 1337   BASOSABS 0.1 03/17/2020 1337    No results found for: "POCLITH", "LITHIUM"   No results found for: "PHENYTOIN", "PHENOBARB", "VALPROATE", "CBMZ"   .res Assessment: Plan:   Will continue Auvelity 45-105 mg one tablet twice daily for depression. Continue Effexor XR 75 mg daily for depression.  Pt to follow-up in 6 months or sooner if clinically indicated.  Patient advised to contact office with any questions, adverse effects, or  acute worsening in signs and symptoms.   Victoria Baxter was seen today for follow-up.  Diagnoses and all orders for this visit:  Recurrent major depressive disorder, in full remission ([place]) -     Dextromethorphan-buPROPion ER (AUVELITY) 45-105 MG TBCR; Take 1 tablet by mouth 2 (two) times daily. -     venlafaxine XR (EFFEXOR-XR) 75 MG 24 hr capsule; Take 1 capsule (75 mg total) by mouth daily with breakfast.     Please see After Visit Summary for patient specific instructions.  Future Appointments  Date Time Provider [place]  12/03/2022  3:00 PM Salvadore Dom, MD [facility] None  10/18/2023  8:30 AM Thayer Headings, PMHNP [facility] None    No orders of the defined types were placed in this encounter.   -------------------------------

## 2022-11-16 ENCOUNTER — Encounter: Payer: Self-pay | Admitting: Obstetrics and Gynecology

## 2022-12-03 ENCOUNTER — Ambulatory Visit: Payer: No Typology Code available for payment source | Admitting: Obstetrics and Gynecology

## 2022-12-05 NOTE — Progress Notes (Signed)
52 y.o. G0P0000 Single White or Caucasian Not Hispanic or Latino female here for annual exam.  She is on loseasonoique to suppress endometriosis. The patient went off of OCP's several weeks ago in order to get an [facility] today. She gets bad headaches when she is off the active pills. No auras. She has had daily migraines since stopping the pill a few weeks ago, low level. No hot flashes or night sweats.  Period Cycle (Days): 84 Period Duration (Days): 5 Period Pattern: Regular Menstrual Flow: Heavy Menstrual Control: Maxi pad Menstrual Control Change Freq (Hours): 6 Dysmenorrhea: None   H/O fibroid uterus.   Patient's last menstrual period was 11/21/2022.          Sexually active: No.  The current method of family planning is OCP (estrogen/progesterone) and abstinence.    Exercising: No.  The patient does not participate in regular exercise at present. Smoker:  no  Health Maintenance: Pap:   11/28/21 WNL Hr Hpv neg, 07/24/18 Neg:Neg HR HPV  History of abnormal Pap no MMG:  05/23/20 Density C Bi-rads 1 neg  BMD:   none  Colonoscopy: 2009 TDaP:  2019  Gardasil: n/a   reports that she has never smoked. She has never used smokeless tobacco. She reports current alcohol use. She reports that she does not use drugs. Just occasional ETOH. 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   Past Medical History:  Diagnosis Date   Depression    Depression    Endometriosis    Fibroid     Past Surgical History:  Procedure Laterality Date   DILATION AND CURETTAGE OF UTERUS     LAPAROSCOPY ABDOMEN DIAGNOSTIC      Current Outpatient Medications  Medication Sig Dispense Refill   Cholecalciferol (VITAMIN D PO) Take by mouth daily.      Dextromethorphan-buPROPion ER (AUVELITY) 45-105 MG TBCR Take 1 tablet by mouth 2 (two) times daily. 180 tablet 3   Levonorgestrel-Ethinyl Estradiol (LOSEASONIQUE) 0.1-0.02 & 0.01 MG tablet Take 1 tablet by mouth daily. 91 tablet 4   Multiple Vitamin (MULTIVITAMIN)  tablet Take 1 tablet by mouth daily.     NON FORMULARY 700 mg. Guduchi     venlafaxine XR (EFFEXOR-XR) 75 MG 24 hr capsule Take 1 capsule (75 mg total) by mouth daily with breakfast. 90 capsule 3   No current facility-administered medications for this visit.    Family History  Problem Relation Age of Onset   Uterine cancer Mother 40   Other Mother        brain tumor 2001   Basal cell carcinoma Father    Prostate cancer Father    Other Father        sarcomatoid carcinoma   Breast cancer Maternal Aunt    Cancer Paternal Aunt    Diabetes Paternal Grandmother    Depression Maternal Grandmother    Anxiety disorder Brother     Review of Systems  All other systems reviewed and are negative.   Exam:   BP 116/74   Pulse 73   Ht  (1.778 m)   Wt 178 lb (80.7 kg)   LMP 11/21/2022   SpO2 100%   BMI 25.54 kg/m   Weight change: @ Height:   Height:  (177.8 cm)  Ht Readings from Last 3 Encounters:  12/19/22  (1.778 m)  11/28/21  (1.753 m)  11/11/20 5' 9.25" (1.759 m)    General appearance: alert, cooperative and appears stated age Head: Normocephalic,  without obvious abnormality, atraumatic Neck: no adenopathy, supple, symmetrical, trachea midline and thyroid normal to inspection and palpation Lungs: clear to auscultation bilaterally Cardiovascular: regular rate and rhythm Breasts: normal appearance, no masses or tenderness Abdomen: soft, non-tender; non distended,  no masses,  no organomegaly Extremities: extremities normal, atraumatic, no cyanosis or edema Skin: Skin color, texture, turgor normal. No rashes or lesions Lymph nodes: Cervical, supraclavicular, and axillary nodes normal. No abnormal inguinal nodes palpated Neurologic: Grossly normal   Pelvic: External genitalia:  no lesions              Urethra:  normal appearing urethra with no masses, tenderness or lesions              Bartholins and Skenes: normal                 Vagina:  normal appearing vagina with normal color and discharge, no lesions              Cervix: no lesions               Bimanual Exam:  Uterus:   retroverted, irregular 10-12 week sized, not tender  (known fibroids)              Adnexa: no mass, fullness, tenderness               Rectovaginal: Confirms               Anus:  normal sphincter tone, no lesions  Carolynn Serve, CMA chaperoned for the exam.  1. Well woman exam Discussed breast self exam Discussed calcium and vit D intake Mammogram is overdue, she will schedule (# given)  2. Elevated lipids Fasting - Lipid panel  3. Elevated LFTs - Comprehensive metabolic panel  4. Elevated hemoglobin - CBC  5. Encounter for surveillance of contraceptive pills She has been off of OCP's for several weeks.  - Follicle stimulating hormone -Will make further plans on OCP's depending on her FSH  6. Colon cancer screening - Ambulatory referral to Gastroenterology

## 2022-12-19 ENCOUNTER — Ambulatory Visit (INDEPENDENT_AMBULATORY_CARE_PROVIDER_SITE_OTHER): Payer: No Typology Code available for payment source | Admitting: Obstetrics and Gynecology

## 2022-12-19 ENCOUNTER — Encounter: Payer: Self-pay | Admitting: Obstetrics and Gynecology

## 2022-12-19 VITALS — BP 116/74 | HR 73 | Ht 70.0 in | Wt 178.0 lb

## 2022-12-19 DIAGNOSIS — D582 Other hemoglobinopathies: Secondary | ICD-10-CM

## 2022-12-19 DIAGNOSIS — Z01419 Encounter for gynecological examination (general) (routine) without abnormal findings: Secondary | ICD-10-CM | POA: Diagnosis not present

## 2022-12-19 DIAGNOSIS — R7989 Other specified abnormal findings of blood chemistry: Secondary | ICD-10-CM

## 2022-12-19 DIAGNOSIS — Z3041 Encounter for surveillance of contraceptive pills: Secondary | ICD-10-CM | POA: Diagnosis not present

## 2022-12-19 DIAGNOSIS — Z1211 Encounter for screening for malignant neoplasm of colon: Secondary | ICD-10-CM

## 2022-12-19 DIAGNOSIS — E785 Hyperlipidemia, unspecified: Secondary | ICD-10-CM

## 2022-12-19 NOTE — Patient Instructions (Signed)

## 2022-12-20 LAB — CBC
HCT: 47.6 % — ABNORMAL HIGH (ref 35.0–45.0)
Hemoglobin: 16.4 g/dL — ABNORMAL HIGH (ref 11.7–15.5)
MCH: 31.4 pg (ref 27.0–33.0)
MCHC: 34.5 g/dL (ref 32.0–36.0)
MCV: 91.2 fL (ref 80.0–100.0)
MPV: 9.9 fL (ref 7.5–12.5)
Platelets: 289 10*3/uL (ref 140–400)
RBC: 5.22 Million/uL — ABNORMAL HIGH (ref 3.80–5.10)
RDW: 11.9 % (ref 11.0–15.0)
WBC: 5.9 10*3/uL (ref 3.8–10.8)

## 2022-12-20 LAB — COMPLETE METABOLIC PANEL WITHOUT GFR
AG Ratio: 2 (calc) (ref 1.0–2.5)
ALT: 45 U/L — ABNORMAL HIGH (ref 6–29)
AST: 24 U/L (ref 10–35)
Albumin: 4.7 g/dL (ref 3.6–5.1)
Alkaline phosphatase (APISO): 121 U/L (ref 37–153)
BUN/Creatinine Ratio: 16 (calc) (ref 6–22)
BUN: 17 mg/dL (ref 7–25)
CO2: 24 mmol/L (ref 20–32)
Calcium: 10.1 mg/dL (ref 8.6–10.4)
Chloride: 107 mmol/L (ref 98–110)
Creat: 1.06 mg/dL — ABNORMAL HIGH (ref 0.50–1.03)
Globulin: 2.3 g/dL (ref 1.9–3.7)
Glucose, Bld: 97 mg/dL (ref 65–99)
Potassium: 4.7 mmol/L (ref 3.5–5.3)
Sodium: 142 mmol/L (ref 135–146)
Total Bilirubin: 0.7 mg/dL (ref 0.2–1.2)
Total Protein: 7 g/dL (ref 6.1–8.1)
eGFR: 63 mL/min/{1.73_m2}

## 2022-12-20 LAB — FOLLICLE STIMULATING HORMONE: FSH: 57.6 m[IU]/mL

## 2022-12-20 LAB — COMPREHENSIVE METABOLIC PANEL
AG Ratio: 2 (calc) (ref 1.0–2.5)
ALT: 45 U/L — ABNORMAL HIGH (ref 6–29)
AST: 24 U/L (ref 10–35)
Albumin: 4.7 g/dL (ref 3.6–5.1)
Alkaline phosphatase (APISO): 121 U/L (ref 37–153)
BUN/Creatinine Ratio: 16 (calc) (ref 6–22)
BUN: 17 mg/dL (ref 7–25)
CO2: 24 mmol/L (ref 20–32)
Calcium: 10.1 mg/dL (ref 8.6–10.4)
Chloride: 107 mmol/L (ref 98–110)
Creat: 1.06 mg/dL — ABNORMAL HIGH (ref 0.50–1.03)
Globulin: 2.3 g/dL (calc) (ref 1.9–3.7)
Glucose, Bld: 97 mg/dL (ref 65–99)
Potassium: 4.7 mmol/L (ref 3.5–5.3)
Sodium: 142 mmol/L (ref 135–146)
Total Bilirubin: 0.7 mg/dL (ref 0.2–1.2)
Total Protein: 7 g/dL (ref 6.1–8.1)

## 2022-12-20 LAB — LIPID PANEL
Cholesterol: 211 mg/dL — ABNORMAL HIGH
HDL: 58 mg/dL
LDL Cholesterol (Calc): 125 mg/dL — ABNORMAL HIGH
Non-HDL Cholesterol (Calc): 153 mg/dL — ABNORMAL HIGH
Total CHOL/HDL Ratio: 3.6 (calc)
Triglycerides: 168 mg/dL — ABNORMAL HIGH

## 2022-12-20 LAB — TEST AUTHORIZATION

## 2022-12-21 ENCOUNTER — Other Ambulatory Visit: Payer: Self-pay | Admitting: Obstetrics and Gynecology

## 2022-12-21 DIAGNOSIS — Z1231 Encounter for screening mammogram for malignant neoplasm of breast: Secondary | ICD-10-CM

## 2022-12-27 ENCOUNTER — Inpatient Hospital Stay (HOSPITAL_BASED_OUTPATIENT_CLINIC_OR_DEPARTMENT_OTHER)
Admission: RE | Admit: 2022-12-27 | Payer: No Typology Code available for payment source | Source: Ambulatory Visit | Admitting: Radiology

## 2022-12-27 DIAGNOSIS — Z1231 Encounter for screening mammogram for malignant neoplasm of breast: Secondary | ICD-10-CM

## 2023-01-03 ENCOUNTER — Encounter (HOSPITAL_BASED_OUTPATIENT_CLINIC_OR_DEPARTMENT_OTHER): Payer: No Typology Code available for payment source | Admitting: Radiology

## 2023-01-08 ENCOUNTER — Ambulatory Visit: Payer: No Typology Code available for payment source | Admitting: Nurse Practitioner

## 2023-01-08 ENCOUNTER — Encounter: Payer: Self-pay | Admitting: Nurse Practitioner

## 2023-01-08 VITALS — BP 120/74 | HR 85 | Wt 180.8 lb

## 2023-01-08 DIAGNOSIS — D751 Secondary polycythemia: Secondary | ICD-10-CM

## 2023-01-08 DIAGNOSIS — Z23 Encounter for immunization: Secondary | ICD-10-CM

## 2023-01-08 DIAGNOSIS — R7989 Other specified abnormal findings of blood chemistry: Secondary | ICD-10-CM

## 2023-01-08 DIAGNOSIS — R7401 Elevation of levels of liver transaminase levels: Secondary | ICD-10-CM

## 2023-01-08 NOTE — Progress Notes (Signed)
Tollie Eth, DNP, AGNP-c [facility] [address] [place], [place] [address] [phone]  Subjective:   Ellanore Bowers is a 52 y.o. female presents to day for evaluation of: Madoline presents today for further evaluation after her gynecologist noted consistently high hemoglobin levels over the last two years during her routine checks and blood work. Her creatinine levels were also elevated with the recent labs. Her oncologist recommended additional testing, prompting this visit.  The patient denies taking testosterone or having any respiratory conditions like asthma, which could potentially explain the elevated hemoglobin levels. She also confirms that she does not take creatinine supplements and has not been engaging in exercise that could affect her creatinine levels.  Tamaria reports a recent incident where she swallowed her temporary dental crown, but her dentist reassured her that it was fine.   She has been using guduchi for migraines and allergies, noting its potential effect on platelet count, although her platelet levels are currently normal.  The patient also shares that she stopped using birth control on March 20th and inquires whether it could be related to her current condition of high hemoglobin and the unspecified parameter.   PMH, Medications, and Allergies reviewed and updated in chart as appropriate.   ROS negative except for what is listed in HPI. Objective:  BP 120/74   Pulse 85   Wt 180 lb 12.8 oz (82 kg)   LMP 11/21/2022   BMI 25.94 kg/m  Physical Exam Vitals and nursing note reviewed.  Constitutional:      Appearance: Normal appearance.  HENT:     Head: Normocephalic.  Eyes:     Pupils: Pupils are equal, round, and reactive to light.  Neck:     Vascular: No carotid bruit.  Cardiovascular:     Rate and Rhythm: Normal rate and regular rhythm.     Pulses: Normal pulses.     Heart sounds: Normal heart sounds.  Pulmonary:      Effort: Pulmonary effort is normal.     Breath sounds: Normal breath sounds.  Musculoskeletal:        General: Normal range of motion.  Skin:    General: Skin is warm and dry.  Neurological:     General: No focal deficit present.     Mental Status: She is alert.  Psychiatric:        Mood and Affect: Mood normal.           Assessment & Plan:   Problem List Items Addressed This Visit     Polycythemia - Primary    Patient has a history of elevated hemoglobin and red blood cell count for the past two years. No known respiratory conditions, sleep apnea, or testosterone use. Red blood cell indices (MCV, MCH, RDW) are within normal limits. Further investigation is needed to determine the cause of the elevated levels. Do not suspect cessation of OCP is contributory. Platelet levels are normal (low), which helps to rule out polycythemia vera. Review of her previous lab work shows she has consistently been high or on the border of high for many years suggesting possible normal values for her.  Plan: - Review patient's previous blood work results to identify any trends or patterns - Discuss the case with a hematologist, if possible, for additional input - Educate the patient on potential causes and risks associated with elevated hemoglobin and red blood cell count - Monitor the patient's levels and re-evaluate as needed      Elevated serum creatinine  Patient's creatinine level is slightly elevated. GFR results are pending. No current creatinine supplement use or excessive exercise reported.  Further evaluation is needed to determine the cause of the elevation and assess kidney function. Plan: - Calculate GFR once results are available - Monitor creatinine and GFR levels and re-evaluate as needed - Consider referral to a nephrologist if kidney function is found to be compromised      ALT (SGPT) level raised    Patient has a mildly elevated ALT level. No history of significant alcohol  consumption, Tylenol use, or fatty liver. Suspect this is a temporary finding. Further evaluation is needed to determine the cause of the elevation. Plan: - Review patient's medication list to assess potential drug-induced liver injury - Monitor ALT levels and re-evaluate in a few weeks to see if these have returned to normal.       Other Visit Diagnoses     Need for shingles vaccine       Relevant Orders   Zoster Recombinant (Shingrix ) (Completed)       Time: 52 minutes, >50% spent counseling, care coordination, chart review, and documentation.    Tollie Eth, DNP, AGNP-c 01/25/2023  7:48 PM    History, Medications, Surgery, SDOH, and Family History reviewed and updated as appropriate.

## 2023-01-10 ENCOUNTER — Ambulatory Visit (HOSPITAL_BASED_OUTPATIENT_CLINIC_OR_DEPARTMENT_OTHER)
Admission: RE | Admit: 2023-01-10 | Discharge: 2023-01-10 | Disposition: A | Discharge status: Home / Self Care | Payer: No Typology Code available for payment source | Source: Ambulatory Visit | Attending: Obstetrics and Gynecology | Admitting: Obstetrics and Gynecology

## 2023-01-10 DIAGNOSIS — Z1231 Encounter for screening mammogram for malignant neoplasm of breast: Secondary | ICD-10-CM | POA: Diagnosis present

## 2023-01-15 ENCOUNTER — Other Ambulatory Visit: Payer: Self-pay | Admitting: Obstetrics and Gynecology

## 2023-01-16 NOTE — Telephone Encounter (Signed)
Her FSH was in a menopausal range. I sent her a mychart message recommending she stop the birth control pill. She should reach out with vaginal bleeding or any significant menopausal symptoms (ie hot flashes, night sweats)

## 2023-01-16 NOTE — Telephone Encounter (Signed)
Pt was advised of reaching out w/ any vaginal bleeding or other concerns via [facility] result note from 12/19/2022. Per result note: msg read by pt on 12/20/2022. Will close this.

## 2023-01-16 NOTE — Telephone Encounter (Signed)
Med refill request: Lose[facility] Last AEX: 12/19/22, going back on OCP depending on [facility]? Last MMG (if hormonal med) 01/10/23 Refill authorized: Please Advise?

## 2023-01-21 ENCOUNTER — Encounter: Payer: Self-pay | Admitting: Gastroenterology

## 2023-01-23 ENCOUNTER — Encounter: Payer: Self-pay | Admitting: Nurse Practitioner

## 2023-01-25 ENCOUNTER — Encounter: Payer: Self-pay | Admitting: Nurse Practitioner

## 2023-01-25 DIAGNOSIS — D751 Secondary polycythemia: Secondary | ICD-10-CM | POA: Insufficient documentation

## 2023-01-25 DIAGNOSIS — R7989 Other specified abnormal findings of blood chemistry: Secondary | ICD-10-CM | POA: Insufficient documentation

## 2023-01-25 DIAGNOSIS — R7401 Elevation of levels of liver transaminase levels: Secondary | ICD-10-CM | POA: Insufficient documentation

## 2023-01-25 NOTE — Assessment & Plan Note (Signed)
Patient has a mildly elevated ALT level. No history of significant alcohol consumption, Tylenol use, or fatty liver. Suspect this is a temporary finding. Further evaluation is needed to determine the cause of the elevation. Plan: - Review patient's medication list to assess potential drug-induced liver injury - Monitor ALT levels and re-evaluate in a few weeks to see if these have returned to normal.

## 2023-01-25 NOTE — Assessment & Plan Note (Addendum)
Patient has a history of elevated hemoglobin and red blood cell count for the past two years. No known respiratory conditions, sleep apnea, or testosterone use. Red blood cell indices (MCV, MCH, RDW) are within normal limits. Further investigation is needed to determine the cause of the elevated levels. Do not suspect cessation of OCP is contributory. Platelet levels are normal (low), which helps to rule out polycythemia vera. Review of her previous lab work shows she has consistently been high or on the border of high for many years suggesting possible normal values for her.  Plan: - Review patient's previous blood work results to identify any trends or patterns - Discuss the case with a hematologist, if possible, for additional input - Educate the patient on potential causes and risks associated with elevated hemoglobin and red blood cell count - Monitor the patient's levels and re-evaluate as needed

## 2023-01-25 NOTE — Assessment & Plan Note (Signed)
Patient's creatinine level is slightly elevated. GFR results are pending. No current creatinine supplement use or excessive exercise reported.  Further evaluation is needed to determine the cause of the elevation and assess kidney function. Plan: - Calculate GFR once results are available - Monitor creatinine and GFR levels and re-evaluate as needed - Consider referral to a nephrologist if kidney function is found to be compromised

## 2023-03-11 ENCOUNTER — Ambulatory Visit (AMBULATORY_SURGERY_CENTER): Payer: No Typology Code available for payment source

## 2023-03-11 ENCOUNTER — Other Ambulatory Visit: Payer: Self-pay

## 2023-03-11 VITALS — Ht 70.0 in | Wt 180.0 lb

## 2023-03-11 DIAGNOSIS — Z1211 Encounter for screening for malignant neoplasm of colon: Secondary | ICD-10-CM

## 2023-03-11 MED ORDER — NA SULFATE-K SULFATE-MG SULF 17.5-3.13-1.6 GM/177ML PO SOLN
1.0000 | Freq: Once | ORAL | 0 refills | Status: AC
Start: 2023-03-11 — End: 2023-03-11

## 2023-03-11 NOTE — Progress Notes (Signed)
Denies allergies to eggs or soy products. Denies complication of anesthesia or sedation. Denies use of weight loss medication. Denies use of O2.   Emmi instructions given for colonoscopy.  

## 2023-03-14 ENCOUNTER — Other Ambulatory Visit (INDEPENDENT_AMBULATORY_CARE_PROVIDER_SITE_OTHER): Payer: No Typology Code available for payment source

## 2023-03-14 DIAGNOSIS — Z23 Encounter for immunization: Secondary | ICD-10-CM

## 2023-03-15 ENCOUNTER — Encounter: Payer: Self-pay | Admitting: Gastroenterology

## 2023-03-27 ENCOUNTER — Encounter: Payer: No Typology Code available for payment source | Admitting: Gastroenterology

## 2023-04-05 ENCOUNTER — Encounter: Payer: Self-pay | Admitting: Gastroenterology

## 2023-04-05 ENCOUNTER — Ambulatory Visit: Payer: No Typology Code available for payment source | Admitting: Gastroenterology

## 2023-04-05 VITALS — BP 109/75 | HR 71 | Temp 98.9°F | Resp 14 | Ht 70.0 in | Wt 180.0 lb

## 2023-04-05 DIAGNOSIS — Z1211 Encounter for screening for malignant neoplasm of colon: Secondary | ICD-10-CM

## 2023-04-05 DIAGNOSIS — D120 Benign neoplasm of cecum: Secondary | ICD-10-CM

## 2023-04-05 MED ORDER — SODIUM CHLORIDE 0.9 % IV SOLN: 500.0000 mL | INTRAVENOUS | Status: DC

## 2023-04-05 NOTE — Progress Notes (Signed)
Called to room to assist during endoscopic procedure.  Patient ID and intended procedure confirmed with present staff. Received instructions for my participation in the procedure from the performing physician.  

## 2023-04-05 NOTE — Op Note (Signed)
[facility] Patient Name: Victoria Baxter Procedure Date: 04/05/2023 1:45 PM MRN: [id number] Endoscopist: Napoleon Form , MD, [id number] Age: 52 Referring MD:  Date of Birth: 27-Aug-1971 Gender: Female Account #: [id number] Procedure:                Colonoscopy Indications:              Screening for colorectal malignant neoplasm Medicines:                Monitored Anesthesia Care Procedure:                Pre-Anesthesia Assessment:                           - Prior to the procedure, a History and Physical                            was performed, and patient medications and                            allergies were reviewed. The patient's tolerance of                            previous anesthesia was also reviewed. The risks                            and benefits of the procedure and the sedation                            options and risks were discussed with the patient.                            All questions were answered, and informed consent                            was obtained. Prior Anticoagulants: The patient has                            taken no anticoagulant or antiplatelet agents. ASA                            Grade Assessment: I - A normal, healthy patient.                            After reviewing the risks and benefits, the patient                            was deemed in satisfactory condition to undergo the                            procedure.                           After obtaining informed consent, the colonoscope  was passed under direct vision. Throughout the                            procedure, the patient's blood pressure, pulse, and                            oxygen saturations were monitored continuously. The                            Olympus Scope SN: [id number] was introduced through                            the anus and advanced to the the cecum, identified                            by appendiceal  orifice and ileocecal valve. The                            colonoscopy was performed without difficulty. The                            patient tolerated the procedure well. The quality                            of the bowel preparation was good. The ileocecal                            valve, appendiceal orifice, and rectum were                            photographed. Scope In: 1:59:52 PM Scope Out: 2:15:56 PM Scope Withdrawal Time: 0 hours 10 minutes 36 seconds  Total Procedure Duration: 0 hours 16 minutes 4 seconds  Findings:                 The perianal and digital rectal examinations were                            normal.                           A 4 mm polyp was found in the cecum. The polyp was                            sessile. The polyp was removed with a cold snare.                            Resection and retrieval were complete.                           A few small-mouthed diverticula were found in the                            sigmoid colon.  Non-bleeding external and internal hemorrhoids were                            found during retroflexion. The hemorrhoids were                            small. Complications:            No immediate complications. Estimated Blood Loss:     Estimated blood loss was minimal. Impression:               - One 4 mm polyp in the cecum, removed with a cold                            snare. Resected and retrieved.                           - Diverticulosis in the sigmoid colon.                           - Non-bleeding external and internal hemorrhoids. Recommendation:           - Patient has a contact number available for                            emergencies. The signs and symptoms of potential                            delayed complications were discussed with the                            patient. Return to normal activities tomorrow.                            Written discharge instructions were provided  to the                            patient.                           - Resume previous diet.                           - Continue present medications.                           - Await pathology results.                           - Repeat colonoscopy in 5-10 years for surveillance                            based on pathology results. Napoleon Form, MD 04/05/2023 2:20:50 PM This report has been signed electronically.

## 2023-04-05 NOTE — Progress Notes (Signed)
[facility] History and Physical   Primary Care Physician:  Early, Sung Amabile, NP   Reason for Procedure:  Colorectal cancer screening  Plan:    Screening colonoscopy with possible interventions as needed     HPI: Victoria Baxter is a very pleasant 52 y.o. female here for screening colonoscopy. Denies any nausea, vomiting, abdominal pain, melena or bright red blood per rectum  The risks and benefits as well as alternatives of endoscopic procedure(s) have been discussed and reviewed. All questions answered. The patient agrees to proceed.    Past Medical History:  Diagnosis Date   Depression    Depression    Endometriosis    Fibroid     Past Surgical History:  Procedure Laterality Date   DILATION AND CURETTAGE OF UTERUS     LAPAROSCOPY ABDOMEN DIAGNOSTIC      Prior to Admission medications   Medication Sig Start Date End Date Taking? Authorizing Provider  Cholecalciferol (VITAMIN D PO) Take by mouth daily.    Yes [provider]  Dextromethorphan-buPROPion ER (AUVELITY) 45-105 MG TBCR Take 1 tablet by mouth 2 (two) times daily. 10/18/22  Yes Corie Chiquito, PMHNP  Multiple Vitamin (MULTIVITAMIN) tablet Take 1 tablet by mouth daily.   Yes [provider]  NON FORMULARY 700 mg. Guduchi   Yes [provider]  venlafaxine XR (EFFEXOR-XR) 75 MG 24 hr capsule Take 1 capsule (75 mg total) by mouth daily with breakfast. 10/18/22  Yes Corie Chiquito, PMHNP    Current Outpatient Medications  Medication Sig Dispense Refill   Cholecalciferol (VITAMIN D PO) Take by mouth daily.      Dextromethorphan-buPROPion ER (AUVELITY) 45-105 MG TBCR Take 1 tablet by mouth 2 (two) times daily. 180 tablet 3   Multiple Vitamin (MULTIVITAMIN) tablet Take 1 tablet by mouth daily.     NON FORMULARY 700 mg. Guduchi     venlafaxine XR (EFFEXOR-XR) 75 MG 24 hr capsule Take 1 capsule (75 mg total) by mouth daily with breakfast. 90 capsule 3   Current  Facility-Administered Medications  Medication Dose Route Frequency Provider Last Rate Last Admin   0.9 %  sodium chloride infusion  500 mL Intravenous Continuous [place], Eleonore Chiquito, MD        Allergies as of 04/05/2023   (No Known Allergies)    Family History  Problem Relation Age of Onset   Uterine cancer Mother 53   Other Mother        brain tumor 2001   Basal cell carcinoma Father    Prostate cancer Father    Other Father        sarcomatoid carcinoma   Anxiety disorder Brother    Breast cancer Maternal Aunt    Cancer Paternal Aunt    Depression Maternal Grandmother    Diabetes Paternal Grandmother    Colon cancer Neg Hx    Esophageal cancer Neg Hx    Rectal cancer Neg Hx    Stomach cancer Neg Hx     Social History   Socioeconomic History   Marital status: Single    Spouse name: Not on file   Number of children: Not on file   Years of education: Not on file   Highest education level: Not on file  Occupational History   Not on file  Tobacco Use   Smoking status: Never   Smokeless tobacco: Never  Vaping Use   Vaping status: Never Used  Substance and Sexual Activity   Alcohol use: Yes    Comment:  once every other month   Drug use: Never   Sexual activity: Not Currently    Birth control/protection: Pill  Other Topics Concern   Not on file  Social History Narrative   Not on file   Social Determinants of Health   Financial Resource Strain: Not on file  Food Insecurity: Not on file  Transportation Needs: Not on file  Physical Activity: Not on file  Stress: Not on file  Social Connections: Not on file  Intimate Partner Violence: Not on file    Review of Systems:  All other review of systems negative except as mentioned in the HPI.  Physical Exam: Vital signs in last 24 hours: BP 110/70   Pulse 80   Temp 98.9 F (37.2 C) (Temporal)   Ht 5\' 10"  (1.778 m)   Wt 180 lb (81.6 kg)   LMP 11/21/2022   SpO2 96%   BMI 25.83 kg/m  General:   Alert,  NAD Lungs:  Clear .   Heart:  Regular rate and rhythm Abdomen:  Soft, nontender and nondistended. Neuro/Psych:  Alert and cooperative. Normal mood and affect. A and O x 3  Reviewed labs, radiology imaging, old records and pertinent past GI work up  Patient is appropriate for planned procedure(s) and anesthesia in an ambulatory setting   K. Scherry Ran , MD [phone]

## 2023-04-05 NOTE — Progress Notes (Signed)
Pt's states no medical or surgical changes since previsit or office visit. 

## 2023-04-05 NOTE — Progress Notes (Signed)
Uneventful anesthetic. Report to pacu rn. Vss. Care resumed by rn. 

## 2023-04-05 NOTE — Patient Instructions (Addendum)
Recommendation:- Patient has a contact number available for                            emergencies. The signs and symptoms of potential                            delayed complications were discussed with the                            patient. Return to normal activities tomorrow.                            Written discharge instructions were provided to the                            patient.                           - Resume previous diet.                           - Continue present medications.                           - Await pathology results.                           - Repeat colonoscopy in 5-10 years for surveillance                            based on pathology results.  Handouts on  diverticulosis, hemorrhoids and polyps given.  YOU HAD AN ENDOSCOPIC PROCEDURE TODAY AT THE [facility]:   Refer to the procedure report that was given to you for any specific questions about what was found during the examination.  If the procedure report does not answer your questions, please call your gastroenterologist to clarify.  If you requested that your care partner not be given the details of your procedure findings, then the procedure report has been included in a sealed envelope for you to review at your convenience later.  YOU SHOULD EXPECT: Some feelings of bloating in the abdomen. Passage of more gas than usual.  Walking can help get rid of the air that was put into your GI tract during the procedure and reduce the bloating. If you had a lower endoscopy (such as a colonoscopy or flexible sigmoidoscopy) you may notice spotting of blood in your stool or on the toilet paper. If you underwent a bowel prep for your procedure, you may not have a normal bowel movement for a few days.  Please Note:  You might notice some irritation and congestion in your nose or some drainage.  This is from the oxygen used during your procedure.  There is no need for concern and it should clear up in a  day or so.  SYMPTOMS TO REPORT IMMEDIATELY:  Following lower endoscopy (colonoscopy or flexible sigmoidoscopy):  Excessive amounts of blood in the stool  Significant tenderness or worsening of abdominal pains  Swelling of the abdomen that is new, acute  Fever of 100F or higher  For  urgent or emergent issues, a gastroenterologist can be reached at any hour by calling [phone]. Do not use MyChart messaging for urgent concerns.    DIET:  We do recommend a small meal at first, but then you may proceed to your regular diet.  Drink plenty of fluids but you should avoid alcoholic beverages for 24 hours.  ACTIVITY:  You should plan to take it easy for the rest of today and you should NOT DRIVE or use heavy machinery until tomorrow (because of the sedation medicines used during the test).    FOLLOW UP: Our staff will call the number listed on your records the next business day following your procedure.  We will call around 7:15- 8:00 am to check on you and address any questions or concerns that you may have regarding the information given to you following your procedure. If we do not reach you, we will leave a message.     If any biopsies were taken you will be contacted by phone or by letter within the next 1-3 weeks.  Please call us at [phone] if you have not heard about the biopsies in 3 weeks.    SIGNATURES/CONFIDENTIALITY: You and/or your care partner have signed paperwork which will be entered into your electronic medical record.  These signatures attest to the fact that that the information above on your After Visit Summary has been reviewed and is understood.  Full responsibility of the confidentiality of this discharge information lies with you and/or your care-partner.

## 2023-04-08 ENCOUNTER — Telehealth: Payer: Self-pay

## 2023-04-08 NOTE — Telephone Encounter (Signed)
No answer, left message to call if having any issues or concerns, B.Charlet Harr RN 

## 2023-04-24 ENCOUNTER — Encounter: Payer: Self-pay | Admitting: Gastroenterology

## 2023-07-17 ENCOUNTER — Encounter: Payer: Self-pay | Admitting: Psychiatry

## 2023-07-21 ENCOUNTER — Other Ambulatory Visit: Payer: Self-pay | Admitting: Psychiatry

## 2023-07-21 DIAGNOSIS — F3342 Major depressive disorder, recurrent, in full remission: Secondary | ICD-10-CM

## 2023-07-26 NOTE — Telephone Encounter (Signed)
Pt called at 3:20p.  She got laid off from her job and now only has Medicaid.  She is asking for the [facility] and the Effexor to be sent to   [id number] [place], [place] - [address] [place] [address] Phone: [phone]  Fax: [phone]   I advised her that the [facility] showed it needed a PA. I don't know if that was based on her old insurance with [employer] or the new insuranc with Medicaid.  She states she is also planning on moving out of state and she would like to see if she can get 90 days of both meds.  Next appt 2/14

## 2023-08-08 ENCOUNTER — Other Ambulatory Visit: Payer: Self-pay

## 2023-08-08 DIAGNOSIS — F3342 Major depressive disorder, recurrent, in full remission: Secondary | ICD-10-CM

## 2023-08-08 MED ORDER — VENLAFAXINE HCL ER 75 MG PO CP24
75.0000 mg | ORAL_CAPSULE | Freq: Every day | ORAL | 0 refills | Status: DC
Start: 2023-08-08 — End: 2023-10-23

## 2023-08-08 MED ORDER — AUVELITY 45-105 MG PO TBCR
1.0000 | EXTENDED_RELEASE_TABLET | Freq: Two times a day (BID) | ORAL | 0 refills | Status: DC
Start: 2023-08-08 — End: 2023-10-23

## 2023-08-09 ENCOUNTER — Telehealth: Payer: Self-pay

## 2023-08-09 NOTE — Telephone Encounter (Signed)
Prior Authorization submitted for Auvelity 45-105 mg with [employer] [place] Case: [id number], Status: Approved, Coverage Starts on: 08/09/2023 12:00:00 AM, Coverage Ends on: 08/08/2024 12:00:00 AM.. Authorization Expiration Date: August 08, 2024.

## 2023-09-05 ENCOUNTER — Encounter: Payer: Self-pay | Admitting: Nurse Practitioner

## 2023-10-18 ENCOUNTER — Ambulatory Visit: Payer: No Typology Code available for payment source | Admitting: Psychiatry

## 2023-10-23 ENCOUNTER — Encounter: Payer: Self-pay | Admitting: Nurse Practitioner

## 2023-10-23 ENCOUNTER — Telehealth: Payer: Medicaid Other | Admitting: Nurse Practitioner

## 2023-10-23 VITALS — Wt 183.0 lb

## 2023-10-23 DIAGNOSIS — F325 Major depressive disorder, single episode, in full remission: Secondary | ICD-10-CM

## 2023-10-23 DIAGNOSIS — G43109 Migraine with aura, not intractable, without status migrainosus: Secondary | ICD-10-CM | POA: Insufficient documentation

## 2023-10-23 DIAGNOSIS — F3342 Major depressive disorder, recurrent, in full remission: Secondary | ICD-10-CM

## 2023-10-23 DIAGNOSIS — Z780 Asymptomatic menopausal state: Secondary | ICD-10-CM

## 2023-10-23 MED ORDER — VENLAFAXINE HCL ER 75 MG PO CP24
75.0000 mg | ORAL_CAPSULE | Freq: Every day | ORAL | 3 refills | Status: DC
Start: 2023-10-23 — End: 2024-02-11

## 2023-10-23 MED ORDER — AUVELITY 45-105 MG PO TBCR
1.0000 | EXTENDED_RELEASE_TABLET | Freq: Two times a day (BID) | ORAL | 3 refills | Status: DC
Start: 2023-10-23 — End: 2024-02-11

## 2023-10-23 NOTE — Assessment & Plan Note (Signed)
Menopause with associated weight gain and cessation of headaches previously attributed to birth control pills. Discussed weight management challenges during menopause and the importance of physical activity and a healthy diet. - Encourage continued physical activity and healthy diet to manage weight

## 2023-10-23 NOTE — Assessment & Plan Note (Addendum)
 Major depressive disorder, well-managed with Effexor  XR 75 mg daily and Auvelity  ER 45-105 mg twice daily. Significant improvement in mood, activity levels, and life satisfaction since starting Auvelity  one year ago. No issues with appetite or concentration. Patient highly satisfied with current regimen and noted life-changing impact. - Continue Effexor  XR 75 mg daily - Continue Auvelity  ER 45-105 mg twice daily - Send prescriptions for 90 days with [facility] - Monitor for any changes in symptoms and reach out if needed  Addendum She has tried and failed the following medications: Zoloft - 50 mg December 02, 2000 - Oct 29, 2001 Prozac - 20 mg Jul 15, 2002 - [place] 2004 Lexapro - 20 mg Jun 04, 2011 - Oct 11, 2014

## 2023-10-23 NOTE — Assessment & Plan Note (Signed)
Major depressive disorder, well-managed with Effexor XR 75 mg daily and Auvelity ER 45-105 mg twice daily. Significant improvement in mood, activity levels, and life satisfaction since starting Auvelity one year ago. No issues with appetite or concentration. Patient highly satisfied with current regimen and noted life-changing impact. - Continue Effexor XR 75 mg daily - Continue Auvelity ER 45-105 mg twice daily - Send prescriptions for 90 days with [facility] - Monitor for any changes in symptoms and reach out if needed

## 2023-10-23 NOTE — Progress Notes (Addendum)
 Virtual Visit Encounter mychart visit.   I connected with  Teacher, Mary [place]ord years/pre on 10/23/23 at 11:30 AM EST by secure video and audio telemedicine application. I verified that I am speaking with the correct person using two identifiers.   I introduced myself as a Publishing rights manager with the practice. The limitations of evaluation and management by telemedicine discussed with the patient and the availability of in person appointments. The patient expressed verbal understanding and consent to proceed.  Participating parties in this visit include: Myself and patient  The patient is: Patient Location: Home I am: Provider Location: Office/Clinic Subjective:    CC and HPI: Victoria Baxter is a 53 y.o. year old female presenting for follow up of depression and anxiety medication management.  History of Present Illness Victoria Baxter is a 53 year old female who presents for medication management of her depression.  She is currently managing her depression with Effexor  XR 75 mg daily and Auvelity  ER 45-105 mg twice daily. She started Auvelity  less than a year ago and has experienced significant improvement in her symptoms, describing the change as 'life changing'. She is now actively engaging in activities and social interactions, which she had not done for years. No issues with appetite, concentration, or sleep are reported.  In the past she has tried and failed Zoloft - 50 mg December 02, 2000 - Oct 29, 2001 Prozac - 20 mg Jul 15, 2002 - [place] 2004 and Lexapro - 20 mg Jun 04, 2011 - Oct 11, 2014. Leaving her without many other options.    She started menopause last year and has noticed weight gain, currently weighing 183 pounds, which she attributes to menopause. She has not experienced the headaches she used to have, which she suspects were related to previous birth control pill use.  Her social history includes a recent change in employment; she was unemployed for about four months and has been at  her new job for a month and a half. She is adjusting to the new work environment and routine.   Previously psychiatry has managed her medications, however, her provider recently moved. She has been stable on her current medications for many months and wishes to transition to primary care management at this time.  Past medical history, Surgical history, Family history not pertinant except as noted below, Social history, Allergies, and medications have been entered into the medical record, reviewed, and corrections made.   Review of Systems:  All review of systems negative except what is listed in the HPI  Objective:    Alert and oriented x 4 Speaking in clear sentences with no shortness of breath. No distress.  Impression and Recommendations:    Problem List Items Addressed This Visit     Recurrent major depressive disorder, in full remission (HCC)    >>ASSESSMENT AND PLAN FOR DEPRESSION, MAJOR, SINGLE EPISODE, COMPLETE REMISSION (HCC) WRITTEN ON 10/23/2023  1:56 PM BY Naje Rice E, NP  Major depressive disorder, well-managed with Effexor  XR 75 mg daily and Auvelity  ER 45-105 mg twice daily. Significant improvement in mood, activity levels, and life satisfaction since starting Auvelity  one year ago. No issues with appetite or concentration. Patient highly satisfied with current regimen and noted life-changing impact. - Continue Effexor  XR 75 mg daily - Continue Auvelity  ER 45-105 mg twice daily - Send prescriptions for 90 days with three refills to [facility] [address] - Monitor for any changes in symptoms and reach out if needed      Relevant Medications  Dextromethorphan-buPROPion  ER (AUVELITY ) 45-105 MG TBCR   venlafaxine  XR (EFFEXOR -XR) 75 MG 24 hr capsule   Menopause   Menopause with associated weight gain and cessation of headaches previously attributed to birth control pills. Discussed weight management challenges during menopause and the importance of physical activity and a  healthy diet. - Encourage continued physical activity and healthy diet to manage weight       Other Visit Diagnoses       Depression, major, single episode, complete remission (HCC)   (Chronic)  -  Primary   Relevant Medications   Dextromethorphan-buPROPion  ER (AUVELITY ) 45-105 MG TBCR   venlafaxine  XR (EFFEXOR -XR) 75 MG 24 hr capsule      General Health Maintenance Generally in good health with no other major concerns reported. Recently started a new job and is adjusting to the new routine. - Encourage regular exercise and healthy lifestyle choices - Advise to monitor for any new symptoms and report as needed.  orders and follow up as documented in EMR I discussed the assessment and treatment plan with the patient. The patient was provided an opportunity to ask questions and all were answered. The patient agreed with the plan and demonstrated an understanding of the instructions.   The patient was advised to call back or seek an in-person evaluation if the symptoms worsen or if the condition fails to improve as anticipated.  Follow-Up: with annual exam  I provided 11 minutes of non-face-to-face interaction with this non face-to-face encounter including intake, same-day documentation, and chart review.   [name] CHARLENA Doing, NP , DNP, AGNP-c [facility]

## 2023-10-24 ENCOUNTER — Encounter: Payer: Medicaid Other | Admitting: Nurse Practitioner

## 2024-01-29 ENCOUNTER — Ambulatory Visit: Admitting: Medical

## 2024-02-11 ENCOUNTER — Telehealth: Payer: Self-pay

## 2024-02-11 DIAGNOSIS — F3342 Major depressive disorder, recurrent, in full remission: Secondary | ICD-10-CM

## 2024-02-11 MED ORDER — AUVELITY 45-105 MG PO TBCR: 1.0000 | EXTENDED_RELEASE_TABLET | Freq: Two times a day (BID) | ORAL | 3 refills | Status: DC

## 2024-02-11 MED ORDER — VENLAFAXINE HCL ER 75 MG PO CP24: 75.0000 mg | ORAL_CAPSULE | Freq: Every day | ORAL | 3 refills | Status: AC

## 2024-02-11 NOTE — Telephone Encounter (Signed)
 Prescription Request: Auvelity  tab Venlafaxine  tab  [facility]

## 2024-02-13 ENCOUNTER — Telehealth: Payer: Self-pay

## 2024-02-13 NOTE — Telephone Encounter (Signed)
 Copied from CRM [id number]. Topic: Clinical - Medication Prior Auth >> Feb 12, 2024  4:52 PM Alpha Arts wrote: Reason for CRM: Patient stated she needs prior4 authorization for Dextromethorphan-buPROPion  ER (AUVELITY ) 45-105 MG TBCR   Prior Authorization #: [id number] [facility])  Phamacy: [facility] - [place], [place] - [address] [place] [address] Phone: [phone] Fax: [phone] Hours: Not open 24 hours

## 2024-02-21 ENCOUNTER — Encounter: Payer: Self-pay | Admitting: Nurse Practitioner

## 2024-02-21 ENCOUNTER — Other Ambulatory Visit: Payer: Self-pay | Admitting: Nurse Practitioner

## 2024-02-21 ENCOUNTER — Telehealth: Payer: Self-pay

## 2024-02-21 ENCOUNTER — Other Ambulatory Visit (HOSPITAL_COMMUNITY): Payer: Self-pay

## 2024-02-21 DIAGNOSIS — F3342 Major depressive disorder, recurrent, in full remission: Secondary | ICD-10-CM

## 2024-02-21 NOTE — Telephone Encounter (Signed)
 Hi Sara& Adam     I have received a request for the pts Auvelity  45-105MG  er tablets. However the patient has [employer] and wont cover the drug unless she has trialed their (5) pref'd drugs. Right now Im only seeing she has trialed 2 of the pref'd. Which are effexor  and bupropion .

## 2024-02-24 ENCOUNTER — Other Ambulatory Visit (HOSPITAL_COMMUNITY): Payer: Self-pay

## 2024-02-24 ENCOUNTER — Telehealth: Payer: Self-pay | Admitting: Pharmacist

## 2024-02-24 DIAGNOSIS — F3342 Major depressive disorder, recurrent, in full remission: Secondary | ICD-10-CM

## 2024-02-24 NOTE — Progress Notes (Signed)
   02/24/2024  Patient ID: Victoria Baxter, female   DOB: 1971/04/08, 53 y.o.   MRN: [id number]  Patient was called per referral for help with Auvelity  assistance.  According to chart review, a Prior Authorization has been attempted for the Patient but it was not approved because her insurance wants her to trial five alternative medications prior to paying for Auvelity .  The notes in her chart say she has trialed bupropion  and velafaxine.  Plan: Call patient back in 3-5 business days and attempt to call the insurance on conference call and speak with someone in the prior authorization department  There is a coupon available for Auvelity :   [email or web address] [email or web address]  Victoria Baxter, PharmD, BCACP Clinical Pharmacist [phone] .

## 2024-02-26 ENCOUNTER — Encounter: Payer: Self-pay | Admitting: Pharmacist

## 2024-02-27 ENCOUNTER — Telehealth: Payer: Self-pay | Admitting: Pharmacist

## 2024-02-27 DIAGNOSIS — F3342 Major depressive disorder, recurrent, in full remission: Secondary | ICD-10-CM

## 2024-02-27 NOTE — Progress Notes (Signed)
   02/27/2024  Patient ID: Victoria Baxter, female   DOB: 1971-07-25, 53 y.o.   MRN: [id number]  Received names and dates of other depression medications the Patient said she had tried in the past. These were forwarded over to the Prior Authorization Team to see if that will help with the PA.  The Provider was messaged as well.  Patient was called to keep her up to date with where we are.  Carlos Tipps is working on the [place].  Plan: Follow up next week.   [name], PharmD, BCACP Clinical Pharmacist [phone]

## 2024-02-28 ENCOUNTER — Telehealth: Payer: Self-pay | Admitting: Pharmacist

## 2024-02-28 ENCOUNTER — Encounter: Payer: Self-pay | Admitting: Pharmacist

## 2024-02-28 DIAGNOSIS — F3342 Major depressive disorder, recurrent, in full remission: Secondary | ICD-10-CM

## 2024-02-28 NOTE — Telephone Encounter (Signed)
    Also I did reach out to the patient at 11:34 am today to see about the celexa dates, just to see if this would be beneficial while submitting the Urgent appeal.

## 2024-02-28 NOTE — Telephone Encounter (Signed)
 Hi Victoria Baxter & Victoria Baxter                              I have submitted all documentation to the pts Insurance plan as an ''urgent'' request with the previous medications that the patient has trialed and failed. We will have an update within 24-72 hours.I will report back with the determination once received.    (Key: [id number])

## 2024-02-28 NOTE — Telephone Encounter (Signed)
 Hi Victoria Baxter,                           I have submitted all documentation to the pts Insurance plan as sn ''urgent'' request. We will have an update within 24-72 hours.I will report back with the determination once received.

## 2024-02-28 NOTE — Telephone Encounter (Signed)
 An urgent appeal request has been submitted for Auvelity . Will advise when response is received.  Please be advised that the insurance company makes the final decision if this is qualifies for an urgent or standard appeal. Appeal letter and supporting documentation have been faxed to the urgent fax line [phone]) on 02/28/2024 @1 :30 pm.  Thank you, Devere Pandy, PharmD Clinical Pharmacist  [place]  Direct Dial: [phone]

## 2024-02-28 NOTE — Progress Notes (Signed)
   02/28/2024  Patient ID: Victoria Baxter, female   DOB: [birth year], 53 y.o.   MRN: [id number]  Received message from the Patient's provider and from Pharmacy Technician, Carlos Tipps. All forms and the Provider's note have been submitted to the [employer] company for review.  The request was sent urgent.  Patient was called to let her know where we are in the process. She communicated understanding.  Plan:  Follow up next week.   [name], PharmD, BCACP Clinical Pharmacist [phone]

## 2024-03-03 ENCOUNTER — Other Ambulatory Visit (HOSPITAL_COMMUNITY): Payer: Self-pay

## 2024-03-03 ENCOUNTER — Telehealth: Payer: Self-pay | Admitting: Pharmacist

## 2024-03-03 DIAGNOSIS — F3342 Major depressive disorder, recurrent, in full remission: Secondary | ICD-10-CM

## 2024-03-03 NOTE — Progress Notes (Signed)
   03/03/2024  Patient ID: Victoria Baxter, female   DOB: 07-05-1971, 53 y.o.   MRN: [id number]  Patient was called to follow up on the Prior Authorization for  Auvelity .  Unfortunately, she did not answer her phone. HIPAA compliant message was left on her voicemail.  I received a message from the Pharmacy Prior Authorization team  that the Patient's insurance coverage ended 03/02/2024. Ran an eligibility check for today and no coverage was shown.  Plan: Will follow up with the Patient in 3-5 business days.   [name], PharmD, BCACP Clinical Pharmacist [phone]

## 2024-03-03 NOTE — Telephone Encounter (Signed)
 Please note that I have called and followed up with the patients Insurance ON 7/1 at 9:50 and checked the status of the Rx. I was Informed that the patients Insurance Plan had just ended [place] on 03/02/24. I have made PharmD Cassius Brought aware as well in a separate teams chat. Nothing is further needed at this time.

## 2024-03-10 ENCOUNTER — Telehealth: Payer: Self-pay | Admitting: Pharmacist

## 2024-03-10 DIAGNOSIS — F3342 Major depressive disorder, recurrent, in full remission: Secondary | ICD-10-CM

## 2024-03-10 NOTE — Progress Notes (Signed)
   03/10/2024  Patient ID: Victoria Baxter, female   DOB: 1970-11-12, 52 y.o.   MRN: [id number]  Patient was called regarding medication assistance with Auvelity .  Unfortunately, she did not answer the phone. We had been trying to get the Patient prior authorization for Auvelity  through her insurance but her coverage ended.  A MyChart message was left for the patient about exploring other options for medication assistance if she was uninsured.    Plan: Close Patient's Pharmacy case unless she calls back with interest in exploring other options.  [name], PharmD, BCACP Clinical Pharmacist [phone]

## 2024-03-16 ENCOUNTER — Telehealth: Payer: Self-pay | Admitting: Pharmacist

## 2024-03-16 DIAGNOSIS — F3342 Major depressive disorder, recurrent, in full remission: Secondary | ICD-10-CM

## 2024-03-16 NOTE — Progress Notes (Signed)
   03/16/2024  Patient ID: Victoria Baxter, female   DOB: December 02, 1970, 53 y.o.   MRN: [id number]  Patient called me back to let me know she has been approved for Medicaid and does not need Patient Assistance at this time.  Plan: Leave Patient's Pharmacy Case closed.   [name], PharmD, BCACP Clinical Pharmacist [phone]

## 2024-07-06 ENCOUNTER — Telehealth: Payer: Self-pay

## 2024-07-06 NOTE — Telephone Encounter (Signed)
    Copied from CRM [id number]. Topic: Clinical - Medication Prior Auth >> Jul 06, 2024  2:24 PM Lauren C wrote: Reason for CRM: Pt just changed insurances to [place] (updated on file) and Dextromethorphan-buPROPion  ER (AUVELITY ) 45-105 MG TBCR is requiring a non-formulary exception request.  [facility] - [place], [place] - [address] [place] [address] Phone: [phone]  Fax: [phone]

## 2024-07-09 ENCOUNTER — Other Ambulatory Visit (HOSPITAL_COMMUNITY): Payer: Self-pay

## 2024-07-09 ENCOUNTER — Telehealth: Payer: Self-pay

## 2024-07-09 NOTE — Telephone Encounter (Signed)
 Pharmacy Patient Advocate Encounter   Received notification from Pt Calls Messages that prior authorization for Auvelity  45-105MG  er tablets  is required/requested.   Insurance verification completed.   The patient is insured through [facility].   Per test claim: PA required; PA submitted to above mentioned insurance via Latent Key/confirmation #/EOC Auvelity  45-105MG  er tablets Status is pending

## 2024-07-10 ENCOUNTER — Other Ambulatory Visit (HOSPITAL_COMMUNITY): Payer: Self-pay

## 2024-07-10 ENCOUNTER — Telehealth: Payer: Self-pay

## 2024-07-10 NOTE — Telephone Encounter (Signed)
 Copied from CRM #[id number]. Topic: Clinical - Prescription Issue >> Jul 10, 2024  1:59 PM Gustabo D wrote: Auvelity  45-105MG  er tablets- Pt says her pharmacy says it was denied please call pt back with more info on this

## 2024-07-13 NOTE — Telephone Encounter (Signed)
 Hi Victoria Baxter is aware. Please see previous encounter from 11/3. Nothing further needed at this time until [name]

## 2024-07-14 NOTE — Telephone Encounter (Signed)
 The patient called in stating she has contacted her pharmacy and they told her they still haven't received the prior authorization for the Dextromethorphan-buPROPion  ER (AUVELITY ) 45-105 MG TBCR . Please assist patient as soon as possible

## 2024-07-14 NOTE — Telephone Encounter (Signed)
     Appeal has been sent to Our pharmD on duty

## 2024-07-16 ENCOUNTER — Encounter: Payer: Self-pay | Admitting: Nurse Practitioner

## 2024-07-16 ENCOUNTER — Telehealth: Payer: Self-pay | Admitting: Pharmacist

## 2024-07-16 NOTE — Telephone Encounter (Signed)
 If prescriber would like to do a Peer to Peer with [employer], they would need to call [phone] (opt 3 then opt 1) to schedule. The PA reference #[id number]  If you would like me to submit a written appeal, we would need clinical documentation as to why the patient is unable to try mirtazapine, duloxetine, fluvoxamine, vilazodone.  Please advise on how you would like to proceed.  Thank you, Devere Pandy, PharmD Clinical Pharmacist  [place]  Direct Dial: [phone]

## 2024-07-20 NOTE — Telephone Encounter (Signed)
 Hi Devere,               Please see the encounter from 10/23/23 that the office would like to use for the appeal.

## 2024-07-21 ENCOUNTER — Telehealth: Payer: Self-pay | Admitting: Pharmacist

## 2024-07-21 NOTE — Telephone Encounter (Signed)
 Appeal has been submitted. Will advise when response is received or follow up in 1 week. Please be advised that most companies may take 30 days to make a decision. Appeal letter and all supporting documentation have been faxed to [phone] on 07/21/2024 @11 :55 am.  Thank you, Devere Pandy, PharmD Clinical Pharmacist  [place]  Direct Dial: [phone]

## 2024-08-14 ENCOUNTER — Other Ambulatory Visit (HOSPITAL_COMMUNITY): Payer: Self-pay

## 2024-08-20 ENCOUNTER — Other Ambulatory Visit (HOSPITAL_COMMUNITY): Payer: Self-pay

## 2024-08-26 ENCOUNTER — Other Ambulatory Visit (HOSPITAL_COMMUNITY): Payer: Self-pay

## 2024-08-28 ENCOUNTER — Other Ambulatory Visit (HOSPITAL_COMMUNITY): Payer: Self-pay

## 2024-09-08 ENCOUNTER — Other Ambulatory Visit (HOSPITAL_COMMUNITY): Payer: Self-pay

## 2024-09-14 ENCOUNTER — Other Ambulatory Visit (HOSPITAL_COMMUNITY): Payer: Self-pay

## 2024-09-25 NOTE — Progress Notes (Incomplete)
 "  54 y.o. G0P0000 female here for annual exam. Single. PCP: Oris [name] BRAVO, NP   Patient's last menstrual period was 11/21/2022.    She reports ***. Urine sample provided: ***  Abnormal bleeding: *** Pelvic discharge or pain: *** Breast mass, nipple discharge or skin changes : ***  Sexually active: *** Birth control: *** Last PAP:     Component Value Date/Time   DIAGPAP  11/28/2021 1515    - Negative for intraepithelial lesion or malignancy (NILM)   DIAGPAP  07/24/2018 0000    NEGATIVE FOR INTRAEPITHELIAL LESIONS OR MALIGNANCY.   DIAGPAP  07/24/2018 0000    FUNGAL ORGANISMS PRESENT CONSISTENT WITH CANDIDA SPP.   HPVHIGH Negative 11/28/2021 1515   ADEQPAP  11/28/2021 1515    Satisfactory for evaluation; transformation zone component PRESENT.   ADEQPAP  07/24/2018 0000    Satisfactory for evaluation  endocervical/transformation zone component PRESENT.   Last mammogram: 01/10/23 Birads 1, Density B Last colonoscopy: 04/05/23 q7y  Exercising: *** Smoker: ***  Flowsheet Row Video Visit from 10/23/2023 in [facility]  PHQ-2 Total Score 0    Flowsheet Row Video Visit from 10/23/2023 in [facility]  PHQ-9 Total Score 0     GYN HISTORY: ***  OB History  Gravida Para Term Preterm AB Living  0 0 0 0 0 0  SAB IAB Ectopic Multiple Live Births  0 0 0 0 0   Past Medical History:  Diagnosis Date   Depression    Depression    Endometriosis    Fibroid    Past Surgical History:  Procedure Laterality Date   DILATION AND CURETTAGE OF UTERUS     LAPAROSCOPY ABDOMEN DIAGNOSTIC     Medications Ordered Prior to Encounter[1] Social History   Socioeconomic History   Marital status: Single    Spouse name: Not on file   Number of children: Not on file   Years of education: Not on file   Highest education level: Bachelor's degree (e.g., BA, AB, BS)  Occupational History   Not on file  Tobacco Use   Smoking status: Never   Smokeless tobacco: Never   Vaping Use   Vaping status: Never Used  Substance and Sexual Activity   Alcohol use: Yes    Comment: once every other month   Drug use: Never   Sexual activity: Not Currently    Birth control/protection: Pill  Other Topics Concern   Not on file  Social History Narrative   Not on file   Social Drivers of Health   Tobacco Use: Low Risk (10/23/2023)   Patient History    Smoking Tobacco Use: Never    Smokeless Tobacco Use: Never    Passive Exposure: Not on file  Financial Resource Strain: Low Risk (01/28/2024)   Overall Financial Resource Strain (CARDIA)    Difficulty of Paying Living Expenses: Not very hard  Food Insecurity: Food Insecurity Present (01/28/2024)   Hunger Vital Sign    Worried About Running Out of Food in the Last Year: Sometimes true    Ran Out of Food in the Last Year: Never true  Transportation Needs: No Transportation Needs (01/28/2024)   PRAPARE - Administrator, Civil Service (Medical): No    Lack of Transportation (Non-Medical): No  Physical Activity: Unknown (01/28/2024)   Exercise Vital Sign    Days of Exercise per Week: 0 days    Minutes of Exercise per Session: Not on file  Stress: No Stress Concern Present (01/28/2024)  [employer] of Occupational Health - Occupational Stress Questionnaire    Feeling of Stress : Only a little  Social Connections: Moderately Isolated (01/28/2024)   Social Connection and Isolation Panel    Frequency of Communication with Friends and Family: Never    Frequency of Social Gatherings with Friends and Family: Once a week    Attends Religious Services: More than 4 times per year    Active Member of Clubs or Organizations: Yes    Attends Banker Meetings: More than 4 times per year    Marital Status: Never married  Intimate Partner Violence: Not on file  Depression (PHQ2-9): Low Risk (10/23/2023)   Depression (PHQ2-9)    PHQ-2 Score: 0  Alcohol Screen: Low Risk (01/28/2024)   Alcohol Screen     Last Alcohol Screening Score (AUDIT): 1  Housing: Low Risk (01/28/2024)   Housing Stability Vital Sign    Unable to Pay for Housing in the Last Year: No    Number of Times Moved in the Last Year: 1    Homeless in the Last Year: No  Utilities: Not on file  Health Literacy: Not on file   Family History  Problem Relation Age of Onset   Uterine cancer Mother 79   Other Mother        brain tumor 2001   Basal cell carcinoma Father    Prostate cancer Father    Other Father        sarcomatoid carcinoma   Anxiety disorder Brother    Breast cancer Maternal Aunt    Cancer Paternal Aunt    Depression Maternal Grandmother    Diabetes Paternal Grandmother    Colon cancer Neg Hx    Esophageal cancer Neg Hx    Rectal cancer Neg Hx    Stomach cancer Neg Hx    Allergies[2]   PE There were no vitals filed for this visit. There is no height or weight on file to calculate BMI.  Physical Exam    Assessment and Plan:        There are no diagnoses linked to this encounter. Clotilda FORBES Pa, CMA      [1]  Current Outpatient Medications on File Prior to Visit  Medication Sig Dispense Refill   Cholecalciferol (VITAMIN D PO) Take by mouth daily.      Dextromethorphan-buPROPion  ER (AUVELITY ) 45-105 MG TBCR Take 1 tablet by mouth 2 (two) times daily. 180 tablet 3   Multiple Vitamin (MULTIVITAMIN) tablet Take 1 tablet by mouth daily.     venlafaxine  XR (EFFEXOR -XR) 75 MG 24 hr capsule Take 1 capsule (75 mg total) by mouth daily with breakfast. 90 capsule 3   No current facility-administered medications on file prior to visit.  [2] No Known Allergies  "

## 2024-09-28 ENCOUNTER — Ambulatory Visit: Payer: Self-pay | Admitting: Obstetrics and Gynecology

## 2024-09-29 ENCOUNTER — Other Ambulatory Visit: Payer: Self-pay

## 2024-09-29 ENCOUNTER — Other Ambulatory Visit (HOSPITAL_COMMUNITY): Payer: Self-pay

## 2024-09-29 ENCOUNTER — Encounter: Payer: Self-pay | Admitting: Nurse Practitioner

## 2024-09-29 DIAGNOSIS — F3342 Major depressive disorder, recurrent, in full remission: Secondary | ICD-10-CM

## 2024-10-06 ENCOUNTER — Other Ambulatory Visit: Payer: Self-pay

## 2024-10-06 DIAGNOSIS — F3342 Major depressive disorder, recurrent, in full remission: Secondary | ICD-10-CM

## 2024-10-06 MED ORDER — AUVELITY 45-105 MG PO TBCR: 1.0000 | EXTENDED_RELEASE_TABLET | Freq: Two times a day (BID) | ORAL | 1 refills | Status: AC
